# Patient Record
Sex: Male | Born: 1935 | Race: White | Hispanic: No | Smoking: Former smoker
Health system: Southern US, Community
[De-identification: ages and names within clinical notes are randomized; demographics above are authoritative.]

## PROBLEM LIST (undated history)

## (undated) DIAGNOSIS — A0472 Enterocolitis due to Clostridium difficile, not specified as recurrent: Secondary | ICD-10-CM

## (undated) DIAGNOSIS — G43909 Migraine, unspecified, not intractable, without status migrainosus: Secondary | ICD-10-CM

## (undated) DIAGNOSIS — M1990 Unspecified osteoarthritis, unspecified site: Secondary | ICD-10-CM

## (undated) DIAGNOSIS — Z860100 Personal history of colon polyps, unspecified: Secondary | ICD-10-CM

## (undated) DIAGNOSIS — I10 Essential (primary) hypertension: Secondary | ICD-10-CM

## (undated) DIAGNOSIS — J45909 Unspecified asthma, uncomplicated: Secondary | ICD-10-CM

## (undated) DIAGNOSIS — Z9289 Personal history of other medical treatment: Secondary | ICD-10-CM

## (undated) DIAGNOSIS — H3530 Unspecified macular degeneration: Secondary | ICD-10-CM

## (undated) HISTORY — DX: Personal history of colonic polyps: Z86.010

## (undated) HISTORY — DX: Personal history of colon polyps, unspecified: Z86.0100

## (undated) HISTORY — PX: APPENDECTOMY: SHX54

## (undated) HISTORY — PX: TONSILLECTOMY AND ADENOIDECTOMY: SUR1326

## (undated) HISTORY — DX: Enterocolitis due to Clostridium difficile, not specified as recurrent: A04.72

## (undated) HISTORY — PX: PHOTODYNAMIC THERAPY: SHX862

## (undated) HISTORY — DX: Migraine, unspecified, not intractable, without status migrainosus: G43.909

## (undated) HISTORY — PX: CATARACT EXTRACTION: SUR2

---

## 1998-09-30 DIAGNOSIS — K219 Gastro-esophageal reflux disease without esophagitis: Secondary | ICD-10-CM | POA: Insufficient documentation

## 2007-06-15 DIAGNOSIS — L219 Seborrheic dermatitis, unspecified: Secondary | ICD-10-CM | POA: Insufficient documentation

## 2010-10-31 HISTORY — PX: DENTAL SURGERY: SHX609

## 2012-01-08 DIAGNOSIS — Z131 Encounter for screening for diabetes mellitus: Secondary | ICD-10-CM | POA: Diagnosis not present

## 2012-01-08 DIAGNOSIS — J45909 Unspecified asthma, uncomplicated: Secondary | ICD-10-CM | POA: Diagnosis not present

## 2012-01-08 DIAGNOSIS — N400 Enlarged prostate without lower urinary tract symptoms: Secondary | ICD-10-CM | POA: Diagnosis not present

## 2012-01-08 DIAGNOSIS — Z125 Encounter for screening for malignant neoplasm of prostate: Secondary | ICD-10-CM | POA: Diagnosis not present

## 2012-01-08 DIAGNOSIS — Z0000 Encounter for general adult medical examination without abnormal findings: Secondary | ICD-10-CM | POA: Diagnosis not present

## 2012-02-05 DIAGNOSIS — H353190 Nonexudative age-related macular degeneration, unspecified eye, stage unspecified: Secondary | ICD-10-CM | POA: Diagnosis not present

## 2012-07-28 DIAGNOSIS — Z23 Encounter for immunization: Secondary | ICD-10-CM | POA: Diagnosis not present

## 2013-02-23 DIAGNOSIS — H3530 Unspecified macular degeneration: Secondary | ICD-10-CM | POA: Diagnosis not present

## 2013-04-26 DIAGNOSIS — Z125 Encounter for screening for malignant neoplasm of prostate: Secondary | ICD-10-CM | POA: Diagnosis not present

## 2013-04-26 DIAGNOSIS — Z0000 Encounter for general adult medical examination without abnormal findings: Secondary | ICD-10-CM | POA: Diagnosis not present

## 2013-04-26 DIAGNOSIS — L219 Seborrheic dermatitis, unspecified: Secondary | ICD-10-CM | POA: Diagnosis not present

## 2013-04-26 DIAGNOSIS — R3911 Hesitancy of micturition: Secondary | ICD-10-CM | POA: Diagnosis not present

## 2013-04-26 DIAGNOSIS — E669 Obesity, unspecified: Secondary | ICD-10-CM | POA: Diagnosis not present

## 2013-04-26 LAB — BASIC METABOLIC PANEL: GLUCOSE: 94 mg/dL

## 2013-04-26 LAB — PSA: PSA: 1.6

## 2013-04-26 LAB — TSH: TSH: 1.79 u[IU]/mL (ref <=5.90)

## 2013-07-20 ENCOUNTER — Ambulatory Visit: Payer: Self-pay | Admitting: Family Medicine

## 2013-07-20 DIAGNOSIS — R3911 Hesitancy of micturition: Secondary | ICD-10-CM | POA: Diagnosis not present

## 2013-07-20 DIAGNOSIS — R0602 Shortness of breath: Secondary | ICD-10-CM | POA: Diagnosis not present

## 2013-07-20 DIAGNOSIS — E669 Obesity, unspecified: Secondary | ICD-10-CM | POA: Diagnosis not present

## 2013-07-20 DIAGNOSIS — L219 Seborrheic dermatitis, unspecified: Secondary | ICD-10-CM | POA: Diagnosis not present

## 2013-07-29 DIAGNOSIS — Z23 Encounter for immunization: Secondary | ICD-10-CM | POA: Diagnosis not present

## 2013-08-12 DIAGNOSIS — E669 Obesity, unspecified: Secondary | ICD-10-CM | POA: Diagnosis not present

## 2013-08-12 DIAGNOSIS — L219 Seborrheic dermatitis, unspecified: Secondary | ICD-10-CM | POA: Diagnosis not present

## 2013-08-12 DIAGNOSIS — R3911 Hesitancy of micturition: Secondary | ICD-10-CM | POA: Diagnosis not present

## 2013-08-12 DIAGNOSIS — L570 Actinic keratosis: Secondary | ICD-10-CM | POA: Diagnosis not present

## 2013-08-12 DIAGNOSIS — J449 Chronic obstructive pulmonary disease, unspecified: Secondary | ICD-10-CM | POA: Diagnosis not present

## 2013-11-29 DIAGNOSIS — J45909 Unspecified asthma, uncomplicated: Secondary | ICD-10-CM | POA: Diagnosis not present

## 2013-11-29 DIAGNOSIS — J069 Acute upper respiratory infection, unspecified: Secondary | ICD-10-CM | POA: Diagnosis not present

## 2013-11-29 DIAGNOSIS — E669 Obesity, unspecified: Secondary | ICD-10-CM | POA: Diagnosis not present

## 2013-11-29 DIAGNOSIS — L219 Seborrheic dermatitis, unspecified: Secondary | ICD-10-CM | POA: Diagnosis not present

## 2014-03-24 DIAGNOSIS — H2510 Age-related nuclear cataract, unspecified eye: Secondary | ICD-10-CM | POA: Diagnosis not present

## 2014-03-24 DIAGNOSIS — Z961 Presence of intraocular lens: Secondary | ICD-10-CM | POA: Diagnosis not present

## 2014-06-20 ENCOUNTER — Ambulatory Visit: Payer: Self-pay | Admitting: Family Medicine

## 2014-06-20 DIAGNOSIS — M25569 Pain in unspecified knee: Secondary | ICD-10-CM | POA: Diagnosis not present

## 2014-06-20 DIAGNOSIS — M79609 Pain in unspecified limb: Secondary | ICD-10-CM | POA: Diagnosis not present

## 2014-06-20 DIAGNOSIS — L219 Seborrheic dermatitis, unspecified: Secondary | ICD-10-CM | POA: Diagnosis not present

## 2014-06-20 DIAGNOSIS — E669 Obesity, unspecified: Secondary | ICD-10-CM | POA: Diagnosis not present

## 2014-07-21 DIAGNOSIS — Z23 Encounter for immunization: Secondary | ICD-10-CM | POA: Diagnosis not present

## 2014-11-30 DIAGNOSIS — L570 Actinic keratosis: Secondary | ICD-10-CM | POA: Diagnosis not present

## 2014-11-30 DIAGNOSIS — Z23 Encounter for immunization: Secondary | ICD-10-CM | POA: Diagnosis not present

## 2014-11-30 DIAGNOSIS — Z0000 Encounter for general adult medical examination without abnormal findings: Secondary | ICD-10-CM | POA: Diagnosis not present

## 2014-11-30 DIAGNOSIS — L858 Other specified epidermal thickening: Secondary | ICD-10-CM | POA: Diagnosis not present

## 2014-11-30 DIAGNOSIS — J449 Chronic obstructive pulmonary disease, unspecified: Secondary | ICD-10-CM | POA: Diagnosis not present

## 2014-11-30 DIAGNOSIS — Z1389 Encounter for screening for other disorder: Secondary | ICD-10-CM | POA: Diagnosis not present

## 2014-11-30 DIAGNOSIS — N400 Enlarged prostate without lower urinary tract symptoms: Secondary | ICD-10-CM | POA: Diagnosis not present

## 2014-12-13 DIAGNOSIS — L821 Other seborrheic keratosis: Secondary | ICD-10-CM | POA: Diagnosis not present

## 2014-12-13 DIAGNOSIS — D485 Neoplasm of uncertain behavior of skin: Secondary | ICD-10-CM | POA: Diagnosis not present

## 2014-12-13 DIAGNOSIS — L570 Actinic keratosis: Secondary | ICD-10-CM | POA: Diagnosis not present

## 2014-12-13 DIAGNOSIS — D1801 Hemangioma of skin and subcutaneous tissue: Secondary | ICD-10-CM | POA: Diagnosis not present

## 2014-12-13 DIAGNOSIS — L820 Inflamed seborrheic keratosis: Secondary | ICD-10-CM | POA: Diagnosis not present

## 2014-12-13 DIAGNOSIS — X32XXXA Exposure to sunlight, initial encounter: Secondary | ICD-10-CM | POA: Diagnosis not present

## 2015-03-23 ENCOUNTER — Encounter: Payer: Self-pay | Admitting: Urgent Care

## 2015-03-23 ENCOUNTER — Emergency Department: Payer: Medicare Other

## 2015-03-23 ENCOUNTER — Encounter: Payer: Self-pay | Admitting: *Deleted

## 2015-03-23 ENCOUNTER — Emergency Department
Admission: EM | Admit: 2015-03-23 | Discharge: 2015-03-23 | Disposition: A | Discharge status: Home / Self Care | Payer: Medicare Other | Source: Home / Self Care | Attending: Emergency Medicine | Admitting: Emergency Medicine

## 2015-03-23 ENCOUNTER — Observation Stay
Admission: EM | Admit: 2015-03-23 | Discharge: 2015-03-24 | Disposition: A | Discharge status: Home / Self Care | Payer: Medicare Other | Attending: Internal Medicine | Admitting: Internal Medicine

## 2015-03-23 DIAGNOSIS — R0602 Shortness of breath: Secondary | ICD-10-CM

## 2015-03-23 DIAGNOSIS — M1990 Unspecified osteoarthritis, unspecified site: Secondary | ICD-10-CM | POA: Insufficient documentation

## 2015-03-23 DIAGNOSIS — R0609 Other forms of dyspnea: Secondary | ICD-10-CM | POA: Diagnosis not present

## 2015-03-23 DIAGNOSIS — Z79899 Other long term (current) drug therapy: Secondary | ICD-10-CM | POA: Insufficient documentation

## 2015-03-23 DIAGNOSIS — R062 Wheezing: Secondary | ICD-10-CM | POA: Diagnosis not present

## 2015-03-23 DIAGNOSIS — Z9889 Other specified postprocedural states: Secondary | ICD-10-CM | POA: Insufficient documentation

## 2015-03-23 DIAGNOSIS — H3530 Unspecified macular degeneration: Secondary | ICD-10-CM | POA: Insufficient documentation

## 2015-03-23 DIAGNOSIS — J811 Chronic pulmonary edema: Secondary | ICD-10-CM

## 2015-03-23 DIAGNOSIS — I10 Essential (primary) hypertension: Secondary | ICD-10-CM | POA: Insufficient documentation

## 2015-03-23 DIAGNOSIS — Z9849 Cataract extraction status, unspecified eye: Secondary | ICD-10-CM | POA: Insufficient documentation

## 2015-03-23 DIAGNOSIS — J449 Chronic obstructive pulmonary disease, unspecified: Secondary | ICD-10-CM | POA: Diagnosis not present

## 2015-03-23 DIAGNOSIS — Z66 Do not resuscitate: Secondary | ICD-10-CM | POA: Diagnosis not present

## 2015-03-23 DIAGNOSIS — R05 Cough: Secondary | ICD-10-CM | POA: Diagnosis not present

## 2015-03-23 DIAGNOSIS — R0989 Other specified symptoms and signs involving the circulatory and respiratory systems: Secondary | ICD-10-CM | POA: Diagnosis not present

## 2015-03-23 DIAGNOSIS — Z87891 Personal history of nicotine dependence: Secondary | ICD-10-CM | POA: Insufficient documentation

## 2015-03-23 DIAGNOSIS — J45901 Unspecified asthma with (acute) exacerbation: Secondary | ICD-10-CM | POA: Insufficient documentation

## 2015-03-23 DIAGNOSIS — J9801 Acute bronchospasm: Secondary | ICD-10-CM | POA: Insufficient documentation

## 2015-03-23 DIAGNOSIS — Z951 Presence of aortocoronary bypass graft: Secondary | ICD-10-CM | POA: Insufficient documentation

## 2015-03-23 DIAGNOSIS — Z7951 Long term (current) use of inhaled steroids: Secondary | ICD-10-CM | POA: Diagnosis not present

## 2015-03-23 DIAGNOSIS — Z91041 Radiographic dye allergy status: Secondary | ICD-10-CM | POA: Diagnosis not present

## 2015-03-23 DIAGNOSIS — E669 Obesity, unspecified: Secondary | ICD-10-CM | POA: Diagnosis present

## 2015-03-23 HISTORY — DX: Personal history of other medical treatment: Z92.89

## 2015-03-23 HISTORY — DX: Unspecified macular degeneration: H35.30

## 2015-03-23 HISTORY — DX: Unspecified osteoarthritis, unspecified site: M19.90

## 2015-03-23 HISTORY — DX: Unspecified asthma, uncomplicated: J45.909

## 2015-03-23 LAB — BLOOD GAS, ARTERIAL
ALLENS TEST (PASS/FAIL): POSITIVE — AB
Acid-base deficit: 0.7 mmol/L (ref 0.0–2.0)
Bicarbonate: 23.4 mEq/L (ref 21.0–28.0)
FIO2: 0.21 %
O2 Saturation: 94.1 %
PATIENT TEMPERATURE: 37
PH ART: 7.42 (ref 7.350–7.450)
PO2 ART: 70 mmHg — AB (ref 83.0–108.0)
pCO2 arterial: 36 mmHg (ref 32.0–48.0)

## 2015-03-23 LAB — CBC
HCT: 43.1 % (ref 40.0–52.0)
HEMOGLOBIN: 14.2 g/dL (ref 13.0–18.0)
MCH: 27.8 pg (ref 26.0–34.0)
MCHC: 33 g/dL (ref 32.0–36.0)
MCV: 84.1 fL (ref 80.0–100.0)
Platelets: 220 10*3/uL (ref 150–440)
RBC: 5.12 MIL/uL (ref 4.40–5.90)
RDW: 14.6 % — ABNORMAL HIGH (ref 11.5–14.5)
WBC: 9.1 10*3/uL (ref 3.8–10.6)

## 2015-03-23 LAB — BASIC METABOLIC PANEL
Anion gap: 9 (ref 5–15)
BUN: 14 mg/dL (ref 6–20)
CO2: 22 mmol/L (ref 22–32)
Calcium: 9.1 mg/dL (ref 8.9–10.3)
Chloride: 111 mmol/L (ref 101–111)
Creatinine, Ser: 0.86 mg/dL (ref 0.61–1.24)
GFR calc Af Amer: >60 mL/min (ref >60)
GFR calc non Af Amer: >60 mL/min (ref >60)
GLUCOSE: 124 mg/dL — AB (ref 65–99)
POTASSIUM: 3.7 mmol/L (ref 3.5–5.1)
Sodium: 142 mmol/L (ref 135–145)

## 2015-03-23 LAB — TROPONIN I: Troponin I: <0.03 ng/mL (ref <0.031)

## 2015-03-23 LAB — BRAIN NATRIURETIC PEPTIDE: B Natriuretic Peptide: 65 pg/mL (ref 0.0–100.0)

## 2015-03-23 MED ORDER — DEXTROSE 5 % IV SOLN
INTRAVENOUS | Status: AC
  Administered 2015-03-23: 500 mg via INTRAVENOUS
  Filled 2015-03-23: qty 500

## 2015-03-23 MED ORDER — IPRATROPIUM-ALBUTEROL 0.5-2.5 (3) MG/3ML IN SOLN
3.0000 mL | Freq: Once | RESPIRATORY_TRACT | Status: AC
  Administered 2015-03-23: 3 mL via RESPIRATORY_TRACT

## 2015-03-23 MED ORDER — IPRATROPIUM-ALBUTEROL 0.5-2.5 (3) MG/3ML IN SOLN
9.0000 mL | Freq: Once | RESPIRATORY_TRACT | Status: AC
  Administered 2015-03-23: 9 mL via RESPIRATORY_TRACT

## 2015-03-23 MED ORDER — MAGNESIUM SULFATE 2 GM/50ML IV SOLN
2.0000 g | Freq: Once | INTRAVENOUS | Status: AC
  Administered 2015-03-23: 2 g via INTRAVENOUS

## 2015-03-23 MED ORDER — DOXYCYCLINE HYCLATE 100 MG PO TABS
ORAL_TABLET | ORAL | Status: AC
  Filled 2015-03-23: qty 1

## 2015-03-23 MED ORDER — ASPIRIN 81 MG PO CHEW
CHEWABLE_TABLET | ORAL | Status: AC
  Filled 2015-03-23: qty 4

## 2015-03-23 MED ORDER — IPRATROPIUM-ALBUTEROL 0.5-2.5 (3) MG/3ML IN SOLN: 3.0000 mL | Freq: Once | RESPIRATORY_TRACT | Status: DC

## 2015-03-23 MED ORDER — FUROSEMIDE 10 MG/ML IJ SOLN
40.0000 mg | Freq: Once | INTRAMUSCULAR | Status: AC
  Administered 2015-03-23: 40 mg via INTRAVENOUS

## 2015-03-23 MED ORDER — IPRATROPIUM-ALBUTEROL 0.5-2.5 (3) MG/3ML IN SOLN
RESPIRATORY_TRACT | Status: AC
  Filled 2015-03-23: qty 9

## 2015-03-23 MED ORDER — ASPIRIN 81 MG PO CHEW
324.0000 mg | CHEWABLE_TABLET | Freq: Once | ORAL | Status: AC
  Administered 2015-03-23: 324 mg via ORAL

## 2015-03-23 MED ORDER — FUROSEMIDE 10 MG/ML IJ SOLN
INTRAMUSCULAR | Status: AC
  Administered 2015-03-23: 40 mg via INTRAVENOUS
  Filled 2015-03-23: qty 4

## 2015-03-23 MED ORDER — DOXYCYCLINE HYCLATE 100 MG PO TABS: 100.0000 mg | ORAL_TABLET | Freq: Once | ORAL | Status: DC

## 2015-03-23 MED ORDER — METHYLPREDNISOLONE SODIUM SUCC 125 MG IJ SOLR
125.0000 mg | Freq: Once | INTRAMUSCULAR | Status: AC
  Administered 2015-03-23: 125 mg via INTRAVENOUS

## 2015-03-23 MED ORDER — IPRATROPIUM-ALBUTEROL 0.5-2.5 (3) MG/3ML IN SOLN
RESPIRATORY_TRACT | Status: AC
  Administered 2015-03-23: 3 mL via RESPIRATORY_TRACT
  Filled 2015-03-23: qty 6

## 2015-03-23 MED ORDER — METHYLPREDNISOLONE SODIUM SUCC 125 MG IJ SOLR
INTRAMUSCULAR | Status: AC
  Filled 2015-03-23: qty 2

## 2015-03-23 MED ORDER — MAGNESIUM SULFATE 2 GM/50ML IV SOLN
INTRAVENOUS | Status: AC
  Administered 2015-03-23: 2 g via INTRAVENOUS
  Filled 2015-03-23: qty 50

## 2015-03-23 MED ORDER — DEXTROSE 5 % IV SOLN
500.0000 mg | Freq: Once | INTRAVENOUS | Status: AC
  Administered 2015-03-23: 500 mg via INTRAVENOUS

## 2015-03-23 NOTE — H&P (Addendum)
[facility] NAME: Ryan Bentley    MR#:  [id number]  DATE OF BIRTH:  September 13, 1932  DATE OF ADMISSION:  03/23/2015  PRIMARY CARE PHYSICIAN: No primary care provider on file.   REQUESTING/REFERRING PHYSICIAN: [place]  CHIEF COMPLAINT:   Chief Complaint  Patient presents with  . Shortness of Breath    HISTORY OF PRESENT ILLNESS:  Ryan Bentley  is a 81 y.o. male who presents with shortness of breath and dyspnea on exertion. Patient was just in the ED earlier this morning with shortness of breath, felt better after some nebulizer treatments and was discharged home. Shortly after he got home he began to experience and shortness of breath again, he waited through most of the day and decided to come back to the ED this evening when it was not getting any better. He also noted, specifically, some dyspnea on exertion as well throughout the day. He does not have a significant past medical history, and is not on any significant amount of medications. He does have a history of asthma and has had asthma exacerbation in the past. He has inhalers, but only uses them sporadically. Here in the ED he was treated with magnesium, nebulizers, steroids, and azithromycin. Hospitalists were called for asthma exacerbation and evaluation of dyspnea on exertion. Of note his chest x-ray in the ED did also show some mild vascular congestion.  PAST MEDICAL HISTORY:   Past Medical History  Diagnosis Date  . Arthritis   . Macular degeneration   . Asthma     PAST SURGICAL HISTORY:   Past Surgical History  Procedure Laterality Date  . Appendectomy    . Cataract extraction      x2    SOCIAL HISTORY:   History  Substance Use Topics  . Smoking status: Former Research scientist (life sciences)  . Smokeless tobacco: Not on file  . Alcohol Use: 0.0 oz/week    0 Standard drinks or equivalent per week     Comment: 1-2 glasses bourbon in water 3 times per week    FAMILY  HISTORY:   Family History  Problem Relation Age of Onset  . Family history unknown: Yes    DRUG ALLERGIES:   Allergies  Allergen Reactions  . Ivp Dye [Iodinated Diagnostic Agents]     MEDICATIONS AT HOME:   Prior to Admission medications   Medication Sig Start Date End Date Taking? Authorizing Provider  albuterol (PROVENTIL HFA;VENTOLIN HFA) 108 (90 BASE) MCG/ACT inhaler Inhale 2 puffs into the lungs every 6 (six) hours as needed for wheezing or shortness of breath.    Historical Provider, MD  Fish Oil-Cholecalciferol (FISH OIL + D3 PO) Take 1 capsule by mouth 2 (two) times daily.    Historical Provider, MD  fluticasone (VERAMYST) 27.5 MCG/SPRAY nasal spray Place 2 sprays into the nose daily as needed for rhinitis.    Historical Provider, MD  Multiple Vitamins-Minerals (PRESERVISION AREDS) TABS Take 1 tablet by mouth 2 (two) times daily.    Historical Provider, MD    REVIEW OF SYSTEMS:  Review of Systems  Constitutional: Negative for fever, chills, weight loss and malaise/fatigue.  HENT: Negative for ear pain, hearing loss and tinnitus.   Eyes: Negative for blurred vision, double vision, pain and redness.  Respiratory: Positive for shortness of breath. Negative for cough and hemoptysis.        Dyspnea on exertion  Cardiovascular: Negative for chest pain, palpitations, orthopnea and leg swelling.  Gastrointestinal: Negative for  nausea, vomiting, abdominal pain, diarrhea and constipation.  Genitourinary: Negative for dysuria, frequency and hematuria.  Musculoskeletal: Negative for back pain, joint pain and neck pain.  Skin:       No acne, rash, or lesions  Neurological: Negative for dizziness, tremors, focal weakness and weakness.  Endo/Heme/Allergies: Negative for polydipsia. Does not bruise/bleed easily.  Psychiatric/Behavioral: Negative for depression. The patient is not nervous/anxious and does not have insomnia.      VITAL SIGNS:   Filed Vitals:   03/23/15 2028  03/23/15 2126 03/23/15 2128  BP: 196/101 176/95 176/95  Pulse: 79 81 75  Temp: 97.7 F (36.5 C) 98.4 F (36.9 C)   TempSrc: Oral Oral   Resp: 20    Height: 5\' 10"  (1.778 m) 5\' 10"  (1.778 m)   Weight: 108.863 kg (240 lb) 108.863 kg (240 lb)   SpO2: 96% 94% 97%   Wt Readings from Last 3 Encounters:  03/23/15 108.863 kg (240 lb)  03/23/15 108.863 kg (240 lb)    PHYSICAL EXAMINATION:  Physical Exam  Constitutional: He is oriented to person, place, and time. He appears well-developed and well-nourished. No distress.  HENT:  Head: Normocephalic and atraumatic.  Mouth/Throat: Oropharynx is clear and moist.  Eyes: Conjunctivae and EOM are normal. Pupils are equal, round, and reactive to light. No scleral icterus.  Neck: Normal range of motion. Neck supple. No JVD present. No thyromegaly present.  Cardiovascular: Normal rate, regular rhythm and intact distal pulses.  Exam reveals no gallop and no friction rub.   No murmur heard. Respiratory: Effort normal. No respiratory distress. He has wheezes (mild bilateral expiratory). He has no rales.  GI: Soft. Bowel sounds are normal. He exhibits no distension. There is no tenderness.  Musculoskeletal: Normal range of motion. He exhibits no edema.  No arthritis, no gout  Lymphadenopathy:    He has no cervical adenopathy.  Neurological: He is alert and oriented to person, place, and time. No cranial nerve deficit.  No dysarthria, no aphasia  Skin: Skin is warm and dry. No rash noted. No erythema.  Psychiatric: He has a normal mood and affect. His behavior is normal. Judgment and thought content normal.    LABORATORY PANEL:   CBC  Recent Labs Lab 03/23/15 0433  WBC 9.1  HGB 14.2  HCT 43.1  PLT 220   ------------------------------------------------------------------------------------------------------------------  Chemistries   Recent Labs Lab 03/23/15 0433  NA 142  K 3.7  CL 111  CO2 22  GLUCOSE 124*  BUN 14  CREATININE  0.86  CALCIUM 9.1   ------------------------------------------------------------------------------------------------------------------  Cardiac Enzymes  Recent Labs Lab 03/23/15 2148  TROPONINI <0.03   ------------------------------------------------------------------------------------------------------------------  RADIOLOGY:  Dg Chest 1 View  03/23/2015   CLINICAL DATA:  Acute onset of shortness of breath and wheezing. Initial encounter.  EXAM: CHEST  1 VIEW  COMPARISON:  Chest radiograph performed earlier today at 4:12 a.m.  FINDINGS: The lungs are well-aerated. Mild vascular congestion is noted. There is no evidence of focal opacification, pleural effusion or pneumothorax.  The cardiomediastinal silhouette is borderline normal in size. No acute osseous abnormalities are seen.  IMPRESSION: Mild vascular congestion noted.  Lungs remain grossly clear.   Electronically Signed   By: Garald Balding M.D.   On: 03/23/2015 22:23   Dg Chest 2 View (if Patient Has Fever And/or Copd)  03/23/2015   CLINICAL DATA:  Acute onset of shortness of breath and nonproductive cough. Difficulty breathing and wheezing. Initial encounter.  EXAM: CHEST  2 VIEW  COMPARISON:  Chest radiograph performed 07/20/2013  FINDINGS: The lungs are well-aerated. Vascular congestion is noted. Increased interstitial markings may reflect minimal interstitial edema. There is no evidence of pleural effusion or pneumothorax.  The heart is borderline normal in size. No acute osseous abnormalities are seen.  IMPRESSION: Vascular congestion noted. Increased interstitial markings may reflect minimal interstitial edema.   Electronically Signed   By: Garald Balding M.D.   On: 03/23/2015 05:25    EKG:   Orders placed or performed during the hospital encounter of 03/23/15  . ED EKG  (if patient has PMH of COPD)  . ED EKG  (if patient has PMH of COPD)  . EKG 12-Lead  . EKG 12-Lead    IMPRESSION AND PLAN:  Principal Problem:   Asthma  exacerbation - with recurrent ED visits for the same, we will admit, treat with steroids, nebulizers, azithromycin. Patient states he has Advair at home, but only uses it when necessary instead as scheduled. A likely need to restart this medication on a scheduled basis. Active Problems:   Dyspnea on exertion - no cardiac disease in his past medical history, but some mild congestion on his chest x-ray and new dyspnea on exertion raises a concern for some possible development of heart failure. We'll get a BNP, as well as an echocardiogram to evaluate.   Hypertension - no prior diagnosis, will use PRN antihypertensives here.  Consider starting therapy if HTN persists through hospital stay.   All the records are reviewed and case discussed with ED provider. Management plans discussed with the patient and/or family.  DVT PROPHYLAXIS: SubQ lovenox  ADMISSION STATUS: Inpatient  CODE STATUS: DO NOT RESUSCITATE  Advance Directive Documentation        Most Recent Value   Type of Advance Directive  Healthcare Power of Attorney, Living will   Pre-existing out of facility DNR order (yellow form or pink MOST form)     "MOST" Form in Place?        TOTAL TIME TAKING CARE OF THIS PATIENT:45 minutes.    Terrall Bley [place] 03/23/2015, 11:31 PM  [facility]  Office  [phone]  CC: Primary care physician; No primary care provider on file.

## 2015-03-23 NOTE — ED Notes (Signed)
Pt is back today for shortness of breath. Pt has audible wheezing. Pt was seen today for same and diagnosed with "bronchial spasms" and discharged home.  Pt began having shortness of breath and wheezing again this pm.  No CP with this.

## 2015-03-23 NOTE — ED Provider Notes (Signed)
-----------------------------------------   9:50 AM on 03/23/2015 -----------------------------------------  Patient BNP normal. Patient blood gas without any concerning findings. Patient does state he is feeling better. I offered admission however patient stated he would like to go home. He does have an inhaler which I advised he use consistently for the next 24-48 hours. Patient states he will call his PCP today to arrange close follow up. We discussed return precautions.  Nance Pear, MD 03/23/15 [phone]

## 2015-03-23 NOTE — Discharge Instructions (Signed)
As discussed, please follow up with her primary care doctor. Additionally we recommend that you see a cardiologist to have an echocardiogram done. Please seek medical attention for any high fevers, chest pain, shortness of breath, change in behavior, persistent vomiting, bloody stool or any other new or concerning symptoms.  Bronchospasm A bronchospasm is a spasm or tightening of the airways going into the lungs. During a bronchospasm breathing becomes more difficult because the airways get smaller. When this happens there can be coughing, a whistling sound when breathing (wheezing), and difficulty breathing. Bronchospasm is often associated with asthma, but not all patients who experience a bronchospasm have asthma. CAUSES  A bronchospasm is caused by inflammation or irritation of the airways. The inflammation or irritation may be triggered by:   Allergies (such as to animals, pollen, food, or mold). Allergens that cause bronchospasm may cause wheezing immediately after exposure or many hours later.   Infection. Viral infections are believed to be the most common cause of bronchospasm.   Exercise.   Irritants (such as pollution, cigarette smoke, strong odors, aerosol sprays, and paint fumes).   Weather changes. Winds increase molds and pollens in the air. Rain refreshes the air by washing irritants out. Cold air may cause inflammation.   Stress and emotional upset.  SIGNS AND SYMPTOMS   Wheezing.   Excessive nighttime coughing.   Frequent or severe coughing with a simple cold.   Chest tightness.   Shortness of breath.  DIAGNOSIS  Bronchospasm is usually diagnosed through a history and physical exam. Tests, such as chest X-rays, are sometimes done to look for other conditions. TREATMENT   Inhaled medicines can be given to open up your airways and help you breathe. The medicines can be given using either an inhaler or a nebulizer machine.  Corticosteroid medicines may be  given for severe bronchospasm, usually when it is associated with asthma. HOME CARE INSTRUCTIONS   Always have a plan prepared for seeking medical care. Know when to call your health care provider and local emergency services (911 in the [place] Know where you can access local emergency care.  Only take medicines as directed by your health care provider.  If you were prescribed an inhaler or nebulizer machine, ask your health care provider to explain how to use it correctly. Always use a spacer with your inhaler if you were given one.  It is necessary to remain calm during an attack. Try to relax and breathe more slowly.  Control your home environment in the following ways:   Change your heating and air conditioning filter at least once a month.   Limit your use of fireplaces and wood stoves.  Do not smoke and do not allow smoking in your home.   Avoid exposure to perfumes and fragrances.   Get rid of pests (such as roaches and mice) and their droppings.   Throw away plants if you see mold on them.   Keep your house clean and dust free.   Replace carpet with wood, tile, or vinyl flooring. Carpet can trap dander and dust.   Use allergy-proof pillows, mattress covers, and box spring covers.   Wash bed sheets and blankets every week in hot water and dry them in a dryer.   Use blankets that are made of polyester or cotton.   Wash hands frequently. SEEK MEDICAL CARE IF:   You have muscle aches.   You have chest pain.   The sputum changes from clear or white to yellow, green,  gray, or bloody.   The sputum you cough up gets thicker.   There are problems that may be related to the medicine you are given, such as a rash, itching, swelling, or trouble breathing.  SEEK IMMEDIATE MEDICAL CARE IF:   You have worsening wheezing and coughing even after taking your prescribed medicines.   You have increased difficulty breathing.   You develop severe chest  pain. MAKE SURE YOU:   Understand these instructions.  Will watch your condition.  Will get help right away if you are not doing well or get worse. Document Released: 09/19/2003 Document Revised: 09/21/2013 Document Reviewed: 03/08/2013 [facility] Patient Information 2015 [place], [place]. This information is not intended to replace advice given to you by your health care provider. Make sure you discuss any questions you have with your health care provider.

## 2015-03-23 NOTE — ED Provider Notes (Signed)
[facility] Emergency Department Provider Note  ____________________________________________  Time seen: Approximately 6:48 AM  I have reviewed the triage vital signs and the nursing notes.   HISTORY  Chief Complaint Shortness of Breath  HPI Ryan Bentley is a 81 y.o. male who has had progressive worsening shortness of breath over the past several days. Patient states that he has had history of the same in the past but has not had any significant breathing problems for quite some time. Patient's symptoms started 2-3 days ago and it just progressively gotten worse. Patient states that he can hardly get up and walk across a room without having significant dyspnea on exertion. Patient has a rescue inhaler at home but has never had to use any nebulizer treatments. Patient denies any significant history of congestive heart failure and feels like he has had primarily cough and cold symptoms that have progressed and this point over the past week. Patient denies any significant fever or chills. His cough is nonproductive. He denies any septic chest pain like swelling or abdominal complaints. Patient does state he is having pretty significant dyspnea on exertion. He is not having any pain at this time. And exertion and walking of course makes it much worse.   History reviewed. No pertinent past medical history.  There are no active problems to display for this patient.   Past Surgical History  Procedure Laterality Date  . Appendectomy    . Cataract extraction      No current outpatient prescriptions on file.  Allergies Ivp dye  No family history on file.  Social History History  Substance Use Topics  . Smoking status: Former Research scientist (life sciences)  . Smokeless tobacco: Not on file  . Alcohol Use: Yes    Review of Systems Constitutional: No fever/chills Eyes: No visual changes.  ENT: No sore throat. Cardiovascular: Denies chest pain. Respiratory: Patient with  progressive worsening shortness of breath over the past several days. Patient with significant dyspnea on exertion. Gastrointestinal: No abdominal pain.  No nausea, no vomiting.  No diarrhea.  No constipation. Genitourinary: Negative for dysuria. Musculoskeletal: Negative for back pain. Skin: Negative for rash. Neurological: Negative for headaches, focal weakness or numbness.  10-point ROS otherwise negative.  ____________________________________________   PHYSICAL EXAM:  VITAL SIGNS: ED Triage Vitals  Enc Vitals Group     BP 03/23/15 0402 175/67 mmHg     Pulse Rate 03/23/15 0402 72     Resp 03/23/15 0402 18     Temp 03/23/15 0402 97.9 F (36.6 C)     Temp Source 03/23/15 0402 Oral     SpO2 03/23/15 0402 93 %     Weight 03/23/15 0402 240 lb (108.863 kg)     Height 03/23/15 0402 5\' 10"  (1.778 m)     Head Cir --      Peak Flow --      Pain Score 03/23/15 0403 0     Pain Loc --      Pain Edu? --      Excl. in [place]? --     Constitutional: Alert and oriented. Well appearing in mild distress secondary to his shortness of breath. Eyes: Conjunctivae are normal. PERRL. EOMI. Head: Atraumatic. Nose: No congestion/rhinnorhea. Mouth/Throat: Mucous membranes are moist.  Oropharynx non-erythematous. Neck: No stridor.   Cardiovascular: Normal rate, regular rhythm. Grossly normal heart sounds.  Good peripheral circulation. Respiratory: Normal respiratory effort. Patient's lungs with diffuse wheezing bilaterally. Good air exchange. He has some mild tachypnea and mild  retractions. Gastrointestinal: Soft and nontender. No distention. No abdominal bruits. No CVA tenderness. Musculoskeletal: No lower extremity tenderness nor edema.  No joint effusions. Neurologic:  Normal speech and language. No gross focal neurologic deficits are appreciated. Speech is normal. No gait instability. Skin:  Skin is warm, dry and intact. No rash noted. Psychiatric: Mood and affect are normal. Speech and behavior  are normal.  ____________________________________________   LABS (all labs ordered are listed, but only abnormal results are displayed)  Labs Reviewed  BASIC METABOLIC PANEL - Abnormal; Notable for the following:    Glucose, Bld 124 (*)    All other components within normal limits  CBC - Abnormal; Notable for the following:    RDW 14.6 (*)    All other components within normal limits  TROPONIN I   ____________________________________________  EKG  ED ECG REPORT I, Ruby Cola, the attending physician, personally viewed and interpreted this ECG.   Date: 03/23/2015  EKG Time: 0403  Rate: 66  Rhythm: normal EKG, normal sinus rhythm, there are no previous tracings available for comparison, normal sinus rhythm, 1st degree AV block, poor R-wave progression and sinus arrhythmia.  Axis: Normal  Intervals:first-degree A-V block   ST&T Change: None  ____________________________________________  RADIOLOGY  Dg Chest 2 View (if Patient Has Fever And/or Copd)  03/23/2015   CLINICAL DATA:  Acute onset of shortness of breath and nonproductive cough. Difficulty breathing and wheezing. Initial encounter.  EXAM: CHEST  2 VIEW  COMPARISON:  Chest radiograph performed 07/20/2013  FINDINGS: The lungs are well-aerated. Vascular congestion is noted. Increased interstitial markings may reflect minimal interstitial edema. There is no evidence of pleural effusion or pneumothorax.  The heart is borderline normal in size. No acute osseous abnormalities are seen.  IMPRESSION: Vascular congestion noted. Increased interstitial markings may reflect minimal interstitial edema.   Electronically Signed   By: Garald Balding M.D.   On: 03/23/2015 05:25    ____________________________________________   PROCEDURES  Procedure(s) performed: None  Critical Care performed: No  ____________________________________________   INITIAL IMPRESSION / ASSESSMENT AND PLAN / ED COURSE  Pertinent labs & imaging  results that were available during my care of the patient were reviewed by me and considered in my medical decision making (see chart for details).  Patient is going be given some breathing treatments as well as some IV Lasix while awaiting labs and any other studies needed.. Patient's chest x-ray shows some vascular congestion. Patient will get labs EKG and cardiac enzymes at this time as well.  ----------------------------------------- 6:59 AM on 03/23/2015 -----------------------------------------  Assuming care from Dr. Lovena Le.  In short, Ryan Bentley is a 81 y.o. male with a chief complaint of Shortness of Breath .  Refer to the original H&P for additional details.  The current plan of care is to await labs and reevaluation after breathing treatments and IV Lasix. Pt was signed out to Dr. Archie Balboa FINAL CLINICAL IMPRESSION(S) / ED DIAGNOSES  Final diagnoses:  Shortness of breath  Pulmonary vascular congestion  Bronchospasm      Ruby Cola, MD 03/23/15 [phone]

## 2015-03-23 NOTE — ED Notes (Signed)
Patient presents with 2-3 day h/o SOB with NP cough. Patient reports similar episode x 1 year ago - ended up being bronchitis.

## 2015-03-23 NOTE — ED Provider Notes (Signed)
[facility] Emergency Department Provider Note  ____________________________________________  Time seen: Approximately 443 PM  I have reviewed the triage vital signs and the nursing notes.   HISTORY  Chief Complaint Shortness of Breath    HPI Ryan Bentley is a 81 y.o. male with a history of bronchospasm who presents tonight as a bounce back with shortness of breath. He says he was here earlier today about 3:30 in the morning with wheezing. He was given a breathing treatment as well as Lasix for pulmonary vascular congestion. He said that upon leaving the hospital he felt "great." However, after several hours at home his breathing difficulty returns. He denies any pain at this time. No nausea or vomiting. He has been having a cough for several days leading up to this. He says that he has Advair that he takes at home for a previous bronchospasm. He does not have a formal diagnosis of COPD.   History reviewed. No pertinent past medical history.  There are no active problems to display for this patient.   Past Surgical History  Procedure Laterality Date  . Appendectomy    . Cataract extraction      Current Outpatient Rx  Name  Route  Sig  Dispense  Refill  . albuterol (PROVENTIL HFA;VENTOLIN HFA) 108 (90 BASE) MCG/ACT inhaler   Inhalation   Inhale 2 puffs into the lungs every 6 (six) hours as needed for wheezing or shortness of breath.         . Fish Oil-Cholecalciferol (FISH OIL + D3 PO)   Oral   Take 1 capsule by mouth 2 (two) times daily.         . fluticasone (VERAMYST) 27.5 MCG/SPRAY nasal spray   Nasal   Place 2 sprays into the nose daily as needed for rhinitis.         . Multiple Vitamins-Minerals (PRESERVISION AREDS) TABS   Oral   Take 1 tablet by mouth 2 (two) times daily.           Allergies Ivp dye  No family history on file.  Social History History  Substance Use Topics  . Smoking status: Former Research scientist (life sciences)  .  Smokeless tobacco: Not on file  . Alcohol Use: Yes    Review of Systems Constitutional: No fever/chills Eyes: No visual changes. ENT: No sore throat. Cardiovascular: Denies chest pain. Respiratory: As above Gastrointestinal: No abdominal pain.  No nausea, no vomiting.  No diarrhea.  No constipation. Genitourinary: Negative for dysuria. Musculoskeletal: Negative for back pain. Skin: Negative for rash. Neurological: Negative for headaches, focal weakness or numbness.  10-point ROS otherwise negative.  ____________________________________________   PHYSICAL EXAM:  VITAL SIGNS: ED Triage Vitals  Enc Vitals Group     BP 03/23/15 2028 196/101 mmHg     Pulse Rate 03/23/15 2028 79     Resp 03/23/15 2028 20     Temp 03/23/15 2028 97.7 F (36.5 C)     Temp Source 03/23/15 2028 Oral     SpO2 03/23/15 2028 96 %     Weight 03/23/15 2028 240 lb (108.863 kg)     Height 03/23/15 2028 5\' 10"  (1.778 m)     Head Cir --      Peak Flow --      Pain Score 03/23/15 2029 0     Pain Loc --      Pain Edu? --      Excl. in [place]? --     Constitutional: Alert and  oriented. Well appearing and in no acute distress. Eyes: Conjunctivae are normal. PERRL. EOMI. Head: Atraumatic. Nose: No congestion/rhinnorhea. Mouth/Throat: Mucous membranes are moist.  Oropharynx non-erythematous. Neck: No stridor.   Cardiovascular: Normal rate, regular rhythm. Grossly normal heart sounds.  Good peripheral circulation. Respiratory: Increased work of breathing. Speaks in full sentences. Mild supraclavicular retractions. Diffuse wheezing with prolonged expiratory phase and decreased air movement. Gastrointestinal: Soft and nontender. No distention. No abdominal bruits. No CVA tenderness. Easily reducible umbilical hernia that is about 4 cm and circular. Nontender. Musculoskeletal: No lower extremity tenderness nor edema.  No joint effusions. Neurologic:  Normal speech and language. No gross focal neurologic deficits  are appreciated. Speech is normal. No gait instability. Skin:  Skin is warm, dry and intact. No rash noted. Psychiatric: Mood and affect are normal. Speech and behavior are normal.  ____________________________________________   LABS (all labs ordered are listed, but only abnormal results are displayed)  Labs Reviewed  TROPONIN I   ____________________________________________  EKG  ED ECG REPORT I, Schaevitz,  Youlanda Roys, the attending physician, personally viewed and interpreted this ECG.   Date: 03/23/2015  EKG Time: 2031  Rate: 83  Rhythm: normal sinus rhythm with sinus arrhythmia  Axis: Normal axis  Intervals: First degree AV block.  ST&T Change: No ST elevations or depressions. No abnormal T-wave inversions. Several PVCs.  ____________________________________________  RADIOLOGY  Chest x-ray with mild vascular congestion. I personally reviewed the images. ____________________________________________   PROCEDURES    ____________________________________________   INITIAL IMPRESSION / ASSESSMENT AND PLAN / ED COURSE  Pertinent labs & imaging results that were available during my care of the patient were reviewed by me and considered in my medical decision making (see chart for details).  ----------------------------------------- 10:46 PM on 03/23/2015 -----------------------------------------  Patient says feels better after breathing treatments. However, still with increased work of breathing and wheezing. He has less wheezing than prior. However, I feel that given that he is bouncing back within 24 hours and has persistent symptoms that he will benefit from admission to the hospital. Signed out to Dr. Jannifer Franklin. ____________________________________________   FINAL CLINICAL IMPRESSION(S) / ED DIAGNOSES  Acute bronchospasm. Return visit.    Orbie Pyo, MD 03/23/15 [id number]

## 2015-03-23 NOTE — ED Notes (Signed)
Pt uprite on stretcher in exam room with no distress noted; pt reports nonprod cough & SOB last 2-3 days; denies pain; st hx of same with dx bronchitis

## 2015-03-23 NOTE — ED Notes (Signed)
Dr Archie Balboa aware of pt showing a fib on monitor, pt denies history of this

## 2015-03-23 NOTE — ED Notes (Signed)
Voided at least 1000cc urine

## 2015-03-24 ENCOUNTER — Inpatient Hospital Stay: Payer: Medicare Other

## 2015-03-24 ENCOUNTER — Inpatient Hospital Stay (HOSPITAL_BASED_OUTPATIENT_CLINIC_OR_DEPARTMENT_OTHER)
Admit: 2015-03-24 | Discharge: 2015-03-24 | Disposition: A | Discharge status: Home / Self Care | Payer: Medicare Other | Attending: Internal Medicine | Admitting: Internal Medicine

## 2015-03-24 ENCOUNTER — Telehealth: Payer: Self-pay

## 2015-03-24 DIAGNOSIS — J449 Chronic obstructive pulmonary disease, unspecified: Secondary | ICD-10-CM | POA: Diagnosis not present

## 2015-03-24 DIAGNOSIS — J45901 Unspecified asthma with (acute) exacerbation: Secondary | ICD-10-CM

## 2015-03-24 DIAGNOSIS — I2510 Atherosclerotic heart disease of native coronary artery without angina pectoris: Secondary | ICD-10-CM | POA: Diagnosis not present

## 2015-03-24 DIAGNOSIS — R0609 Other forms of dyspnea: Secondary | ICD-10-CM | POA: Diagnosis not present

## 2015-03-24 DIAGNOSIS — R0600 Dyspnea, unspecified: Secondary | ICD-10-CM

## 2015-03-24 DIAGNOSIS — I10 Essential (primary) hypertension: Secondary | ICD-10-CM | POA: Diagnosis not present

## 2015-03-24 LAB — BASIC METABOLIC PANEL
ANION GAP: 6 (ref 5–15)
BUN: 14 mg/dL (ref 6–20)
CO2: 24 mmol/L (ref 22–32)
Calcium: 8.8 mg/dL — ABNORMAL LOW (ref 8.9–10.3)
Chloride: 110 mmol/L (ref 101–111)
Creatinine, Ser: 0.85 mg/dL (ref 0.61–1.24)
GFR calc Af Amer: >60 mL/min (ref >60)
GFR calc non Af Amer: >60 mL/min (ref >60)
Glucose, Bld: 168 mg/dL — ABNORMAL HIGH (ref 65–99)
POTASSIUM: 3.5 mmol/L (ref 3.5–5.1)
SODIUM: 140 mmol/L (ref 135–145)

## 2015-03-24 LAB — CBC
HCT: 41.6 % (ref 40.0–52.0)
HCT: 42.3 % (ref 40.0–52.0)
Hemoglobin: 14.1 g/dL (ref 13.0–18.0)
Hemoglobin: 14.5 g/dL (ref 13.0–18.0)
MCH: 28 pg (ref 26.0–34.0)
MCH: 28.5 pg (ref 26.0–34.0)
MCHC: 33.8 g/dL (ref 32.0–36.0)
MCHC: 34.2 g/dL (ref 32.0–36.0)
MCV: 82.8 fL (ref 80.0–100.0)
MCV: 83.1 fL (ref 80.0–100.0)
PLATELETS: 217 10*3/uL (ref 150–440)
PLATELETS: 214 10*3/uL (ref 150–440)
RBC: 5.03 MIL/uL (ref 4.40–5.90)
RBC: 5.09 MIL/uL (ref 4.40–5.90)
RDW: 14.4 % (ref 11.5–14.5)
RDW: 14.6 % — ABNORMAL HIGH (ref 11.5–14.5)
WBC: 11.5 10*3/uL — ABNORMAL HIGH (ref 3.8–10.6)
WBC: 8.8 10*3/uL (ref 3.8–10.6)

## 2015-03-24 LAB — BRAIN NATRIURETIC PEPTIDE: B NATRIURETIC PEPTIDE 5: 88 pg/mL (ref 0.0–100.0)

## 2015-03-24 MED ORDER — SENNOSIDES-DOCUSATE SODIUM 8.6-50 MG PO TABS: 1.0000 | ORAL_TABLET | Freq: Every evening | ORAL | Status: DC | PRN

## 2015-03-24 MED ORDER — HYDRALAZINE HCL 20 MG/ML IJ SOLN
10.0000 mg | INTRAMUSCULAR | Status: DC | PRN
  Administered 2015-03-24: 10 mg via INTRAVENOUS
  Filled 2015-03-24 (×3): qty 0.5

## 2015-03-24 MED ORDER — DIPHENHYDRAMINE HCL 25 MG PO CAPS
50.0000 mg | ORAL_CAPSULE | Freq: Once | ORAL | Status: AC
  Administered 2015-03-24: 50 mg via ORAL
  Filled 2015-03-24: qty 2

## 2015-03-24 MED ORDER — AZITHROMYCIN 500 MG IV SOLR
500.0000 mg | INTRAVENOUS | Status: DC
  Filled 2015-03-24: qty 500

## 2015-03-24 MED ORDER — METHYLPREDNISOLONE SODIUM SUCC 125 MG IJ SOLR
60.0000 mg | Freq: Four times a day (QID) | INTRAMUSCULAR | Status: DC
  Administered 2015-03-24 (×2): 60 mg via INTRAVENOUS
  Filled 2015-03-24 (×2): qty 2

## 2015-03-24 MED ORDER — HYDRALAZINE HCL 10 MG PO TABS
10.0000 mg | ORAL_TABLET | Freq: Three times a day (TID) | ORAL | Status: DC
Start: 2015-03-24 — End: 2015-03-24
  Administered 2015-03-24: 10 mg via ORAL
  Filled 2015-03-24: qty 1

## 2015-03-24 MED ORDER — LISINOPRIL 20 MG PO TABS
20.0000 mg | ORAL_TABLET | Freq: Every day | ORAL | Status: DC
Start: 2015-03-24 — End: 2015-03-24
  Administered 2015-03-24: 20 mg via ORAL
  Filled 2015-03-24: qty 1

## 2015-03-24 MED ORDER — ACETAMINOPHEN 325 MG PO TABS: 650.0000 mg | ORAL_TABLET | Freq: Four times a day (QID) | ORAL | Status: DC | PRN

## 2015-03-24 MED ORDER — IPRATROPIUM-ALBUTEROL 0.5-2.5 (3) MG/3ML IN SOLN: 9.0000 mL | RESPIRATORY_TRACT | Status: DC | PRN

## 2015-03-24 MED ORDER — FOSINOPRIL SODIUM 20 MG PO TABS: 20.0000 mg | ORAL_TABLET | Freq: Every day | ORAL | Status: DC

## 2015-03-24 MED ORDER — ACETAMINOPHEN 650 MG RE SUPP: 650.0000 mg | Freq: Four times a day (QID) | RECTAL | Status: DC | PRN

## 2015-03-24 MED ORDER — IOHEXOL 350 MG/ML SOLN
100.0000 mL | Freq: Once | INTRAVENOUS | Status: AC | PRN
  Administered 2015-03-24: 100 mL via INTRAVENOUS

## 2015-03-24 MED ORDER — ENOXAPARIN SODIUM 40 MG/0.4ML ~~LOC~~ SOLN
40.0000 mg | SUBCUTANEOUS | Status: DC
  Administered 2015-03-24: 40 mg via SUBCUTANEOUS
  Filled 2015-03-24 (×2): qty 0.4

## 2015-03-24 MED ORDER — SODIUM CHLORIDE 0.9 % IJ SOLN
3.0000 mL | Freq: Two times a day (BID) | INTRAMUSCULAR | Status: DC
  Administered 2015-03-24: 3 mL via INTRAVENOUS

## 2015-03-24 MED ORDER — FLUTICASONE-SALMETEROL 250-50 MCG/DOSE IN AEPB: 1.0000 | INHALATION_SPRAY | Freq: Two times a day (BID) | RESPIRATORY_TRACT | Status: DC

## 2015-03-24 MED ORDER — AZITHROMYCIN 500 MG PO TABS: 500.0000 mg | ORAL_TABLET | Freq: Every day | ORAL | Status: DC

## 2015-03-24 NOTE — Progress Notes (Signed)
*  PRELIMINARY RESULTS* Echocardiogram 2D Echocardiogram has been performed.  Ryan Bentley 03/24/2015, 8:08 AM

## 2015-03-24 NOTE — Telephone Encounter (Signed)
-----   Message from Blain Pais sent at 03/24/2015 12:27 PM EDT ----- Regarding: tcm/ph 05/15/2015 3:00  Dr. Fletcher Anon

## 2015-03-24 NOTE — Care Management (Signed)
Patient discharging home today. Azithromax cost $6 and pharmacy will provide 3 tabs for $0 until order for remaining Rx arrives tomorrow. No further RNCM needs.

## 2015-03-24 NOTE — Progress Notes (Signed)
PT DISCHARGED HOME ON NEW FOSINOPRIL,ZITHROMAX AND ADVAIR. I/S ON  MED ADM/.UNDERSTANDING VOICED. BP WNL . I/S ON F/U APPTS. LEFT VIA W/C

## 2015-03-24 NOTE — Consult Note (Signed)
Cardiology Consultation Note  Patient ID: Ryan Bentley, MRN: [id number], DOB/AGE: 04/24/1932 81 y.o. Admit date: 03/23/2015   Date of Consult: 03/24/2015 Primary Physician: No primary care provider on file. Primary Cardiologist: New to [facility]  Chief Complaint: SOB Reason for Consult: SOB  HPI: 81 y.o. male with h/o asthma, arthritis, macular degeneration and no previously known cardiac history who presented to [facility] x 2 on 6/23 with increased dyspnea and was diagnosed with asthma exacerbation.   He has not previously known cardiac history and has never seen a cardiologist before. No prior echos, stress tests, or cardiac catheterizations. He prefers to not take any medications and only takes them if he absolutely must. He has known asthma dating back to when he was a child that did improve throughout most of his adulthood, though appears to be coming back in his senior years, requiring him to use his Advair Diskus inhaler occasionally, as he does not like to take medications on a daily basis per above. He also has a rescue albuterol inhaler that he has and uses this at the same time when he must use his Advair Diskus inhaler. He has had one severe asthma flare several years ago which he reports almost "killed" him. It is also noted approximately one year ago he had similar symptoms to the above with increase SOB for several days and ultimately ended up being diagnosed at bronchitis.   He previously rode his bike 20,000 miles per year several years ago. He lives a fairly sedentary lifestyle these days. He reports not exercising or walking as much as he would like to. His dog is deaf, has cataracts, and arthritis and does not go on walks anymore.   His weight has been slowly increasing he reports over the years per poor diet choices he reports. No lower extremity edema, orthopnea, early satiety, or abdominal distension. He does not add salt to food or drink excess amounts of salt.   He presents to  [facility] ED around 3:30 AM on 6/23 with increased dyspnea x several days with associated congestion. CXR showed minimal interstitial edema. Negative troponin. WBC 9.1. SCr 0.86. BNP 65. ABG with pO2 70, otherwise unremarkable. He remained in the ED until 10:30 AM receiving several nebs with full resolution of symptoms. He was discharged home and did some work/played with his dog with return of his SOB and wheezing prompting him to return to [facility] ED that evening for evaluation. Upon his arrival he received multiple nebs with improvement in symptoms again. CXR showed mild vascular congestion. Lungs remained grossly clear. Troponin negative. BNP 88. WBC 11.5-->8.8. Echo showed EF 55-60%, no wall motion abnormalities, normal diastolic function, trivial TR, normal PASP. CTA chest has been ordered to rule out PE. He was started on azithromycin, nebs, and steroids. This morning reports feeling back to his baseline and is without any SOB. Never with any chest pain, palpitations, edema, nausea, vomiting, presyncope, syncope.     Past Medical History  Diagnosis Date  . Arthritis   . Macular degeneration   . Asthma   . COPD (chronic obstructive pulmonary disease)   . Anemia   . History of echocardiogram     a. 03/2015: a. EF 55-60%, no WMA, nl diastolic fxn, trivial TR, nl PASP  . HTN (hypertension)       Most Recent Cardiac Studies: Echo 03/24/2015:  Study Conclusions  - Left ventricle: The cavity size was normal. There was mild concentric hypertrophy. Systolic function was normal. The estimated  ejection fraction was in the range of 55% to 60%. Wall motion was normal; there were no regional wall motion abnormalities. Left ventricular diastolic function parameters were normal. - Tricuspid valve: There was trivial regurgitation. - Pulmonary arteries: Systolic pressure was within the normal range.   Surgical History:  Past Surgical History  Procedure Laterality Date  . Appendectomy    .  Cataract extraction      x2  . Vascular surgery    . Coronary artery bypass graft       Home Meds: Prior to Admission medications   Medication Sig Start Date End Date Taking? Authorizing Provider  albuterol (PROVENTIL HFA;VENTOLIN HFA) 108 (90 BASE) MCG/ACT inhaler Inhale 2 puffs into the lungs every 6 (six) hours as needed for wheezing or shortness of breath.   Yes Historical Provider, MD  Fish Oil-Cholecalciferol (FISH OIL + D3 PO) Take 1 capsule by mouth 2 (two) times daily.   Yes Historical Provider, MD  fluticasone (VERAMYST) 27.5 MCG/SPRAY nasal spray Place 2 sprays into the nose daily as needed for rhinitis.   Yes Historical Provider, MD  Multiple Vitamins-Minerals (PRESERVISION AREDS) TABS Take 1 tablet by mouth 2 (two) times daily.   Yes Historical Provider, MD  tamsulosin (FLOMAX) 0.4 MG CAPS capsule Take 0.4 mg by mouth as needed.   Yes Historical Provider, MD    Inpatient Medications:  . azithromycin (ZITHROMAX) 500 MG IVPB  500 mg Intravenous Q24H  . diphenhydrAMINE  50 mg Oral Once  . enoxaparin (LOVENOX) injection  40 mg Subcutaneous Q24H  . hydrALAZINE  10 mg Oral 3 times per day  . lisinopril  20 mg Oral Daily  . methylPREDNISolone (SOLU-MEDROL) injection  60 mg Intravenous Q6H  . sodium chloride  3 mL Intravenous Q12H      Allergies:  Allergies  Allergen Reactions  . Ivp Dye [Iodinated Diagnostic Agents]     History   Social History  . Marital Status: Widowed    Spouse Name: N/A  . Number of Children: N/A  . Years of Education: N/A   Occupational History  . Not on file.   Social History Main Topics  . Smoking status: Former Research scientist (life sciences)  . Smokeless tobacco: Not on file  . Alcohol Use: 0.0 oz/week    0 Standard drinks or equivalent per week     Comment: 1-2 glasses bourbon in water 3 times per week  . Drug Use: No  . Sexual Activity: No   Other Topics Concern  . Not on file   Social History Narrative     Family History  Problem Relation Age of  Onset  . Family history unknown: Yes     Review of Systems: Review of Systems  Constitutional: Positive for malaise/fatigue. Negative for fever, chills, weight loss and diaphoresis.  HENT: Positive for congestion. Negative for hearing loss and sore throat.   Eyes: Negative for blurred vision, discharge and redness.  Respiratory: Positive for cough, shortness of breath and wheezing. Negative for hemoptysis and sputum production.   Cardiovascular: Negative for chest pain, palpitations, orthopnea, claudication, leg swelling and PND.  Gastrointestinal: Negative for heartburn, nausea, vomiting and abdominal pain.  Musculoskeletal: Negative for myalgias and falls.  Skin: Negative for rash.  Neurological: Negative for dizziness, sensory change, speech change, loss of consciousness, weakness and headaches.  Endo/Heme/Allergies: Does not bruise/bleed easily.  Psychiatric/Behavioral: Negative for depression. The patient is not nervous/anxious.   All other systems reviewed and are negative.   Labs:  Recent Labs  03/23/15  6295 03/23/15 2148  TROPONINI <0.03 <0.03   Lab Results  Component Value Date   WBC 8.8 03/24/2015   HGB 14.5 03/24/2015   HCT 42.3 03/24/2015   MCV 83.1 03/24/2015   PLT 214 03/24/2015     Recent Labs Lab 03/24/15 0452  NA 140  K 3.5  CL 110  CO2 24  BUN 14  CREATININE 0.85  CALCIUM 8.8*  GLUCOSE 168*   No results found for: CHOL, HDL, LDLCALC, TRIG No results found for: DDIMER  Radiology/Studies:  Dg Chest 1 View  03/23/2015   CLINICAL DATA:  Acute onset of shortness of breath and wheezing. Initial encounter.  EXAM: CHEST  1 VIEW  COMPARISON:  Chest radiograph performed earlier today at 4:12 a.m.  FINDINGS: The lungs are well-aerated. Mild vascular congestion is noted. There is no evidence of focal opacification, pleural effusion or pneumothorax.  The cardiomediastinal silhouette is borderline normal in size. No acute osseous abnormalities are seen.   IMPRESSION: Mild vascular congestion noted.  Lungs remain grossly clear.   Electronically Signed   By: Garald Balding M.D.   On: 03/23/2015 22:23   Dg Chest 2 View (if Patient Has Fever And/or Copd)  03/23/2015   CLINICAL DATA:  Acute onset of shortness of breath and nonproductive cough. Difficulty breathing and wheezing. Initial encounter.  EXAM: CHEST  2 VIEW  COMPARISON:  Chest radiograph performed 07/20/2013  FINDINGS: The lungs are well-aerated. Vascular congestion is noted. Increased interstitial markings may reflect minimal interstitial edema. There is no evidence of pleural effusion or pneumothorax.  The heart is borderline normal in size. No acute osseous abnormalities are seen.  IMPRESSION: Vascular congestion noted. Increased interstitial markings may reflect minimal interstitial edema.   Electronically Signed   By: Garald Balding M.D.   On: 03/23/2015 05:25    EKG: NSR with sinus arrhythmia with 1st degree AV block, 83 bpm, occasional PVC, no significant st/t changes    Weights: [facility] Weights   03/23/15 2028 03/23/15 2126  Weight: 240 lb (108.863 kg) 240 lb (108.863 kg)     Physical Exam: Blood pressure 179/103, pulse 99, temperature 98 F (36.7 C), temperature source Oral, resp. rate 16, height 5\' 10"  (1.778 m), weight 240 lb (108.863 kg), SpO2 96 %. Body mass index is 34.44 kg/(m^2). General: Well developed, well nourished, in no acute distress. Head: Normocephalic, atraumatic, sclera non-icteric, no xanthomas, nares are without discharge.  Neck: Negative for carotid bruits. JVD not elevated. Lungs: Bilateral wheezing with faint crackles at the bilateral bases. No rhonchi. Breathing is unlabored. Heart: RRR with S1 S2. No murmurs, rubs, or gallops appreciated. Abdomen: Soft, non-tender, non-distended with normoactive bowel sounds. No hepatomegaly. No rebound/guarding. No obvious abdominal masses. Msk:  Strength and tone appear normal for age. Extremities: No clubbing or  cyanosis. No edema.  Distal pedal pulses are 2+ and equal bilaterally. Neuro: Alert and oriented X 3. No facial asymmetry. No focal deficit. Moves all extremities spontaneously. Psych:  Responds to questions appropriately with a normal affect.    Assessment and Plan:  81 y.o. male with h/o asthma, arthritis, macular degeneration and no previously known cardiac history who presented to [facility] x 2 on 6/23 with increased dyspnea and was diagnosed with asthma exacerbation.  1. SOB: -Improved on nebs, steroids, and azithromycin  -Symptoms appear to be more pulmonary in etiology given his negative BNP x 2, wheezing, recent congestion, improvement with nebulizers, and history of asthma.  -Though other etiologies should be ruled out as  well including PE and bronchitis. With normal LV function and right sided pressures unlikely to be pulmonary HTN or hypertensive heart disease in the setting of his poorly controlled BP -Return of his symptoms may indicate need for longer treatment and antibiotic treatment given his prior smoking history  -He historically has not used Advair Diskus as directed, would use daily as directed to prevent exacerbations -Echo showed normal LV function, no wall motion abnormalities, normal diastolic function, trivial TR, PASP normal  -CTA chest pending to rule out PE  2. Accelerated HTN: -Uncontrolled  -Start lisinopril 20 mg daily and hydralazine 10 mg tid with hold parameters   3. Asthma exacerbation/bronchitis: -See #1   4. Remote tobacco abuse    Melvern Banker, PA-C Pager: [phone] 03/24/2015, 10:05 AM

## 2015-03-24 NOTE — Telephone Encounter (Signed)
Attempted to contact pt regarding discharge from [facility] on 03/24/15. Asked him to call back w/ any questions or concerns regarding discharge instructions or medications.  Advised him of appt w/ Dr. Fletcher Anon 05/15/15 @ 3:00. Asked him to call back if he is unable to keep this appt.

## 2015-03-26 NOTE — Discharge Summary (Signed)
[facility] [place] NAME: Ryan Bentley    MR#:  [id number]  DATE OF BIRTH:  09-Apr-1932  DATE OF ADMISSION:  03/23/2015 ADMITTING PHYSICIAN: Lance Coon, MD  DATE OF DISCHARGE: 03/24/2015  2:05 PM  PRIMARY CARE PHYSICIAN: Lelon Huh, MD  ADMISSION DIAGNOSIS:  Wheezing [R06.2]  DISCHARGE DIAGNOSIS:  Principal Problem:   Asthma exacerbation Active Problems:   Dyspnea on exertion   HTN (hypertension)   Asthma  SECONDARY DIAGNOSIS:   Past Medical History  Diagnosis Date  . Arthritis   . Macular degeneration   . Asthma   . COPD (chronic obstructive pulmonary disease)   . Anemia   . History of echocardiogram     a. 03/2015: a. EF 55-60%, no WMA, nl diastolic fxn, trivial TR, nl PASP  . HTN (hypertension)    HOSPITAL COURSE:  81 y.o. male admitted with shortness of breath and dyspnea on exertion. He improved somewhat with nebs, steroids, and azithromycin. Please see Dr Tobey Grim dictated [facility] & P for further details. Symptoms appear to be more pulmonary in etiology given his negative BNP x 2, wheezing, recent congestion, improvement with nebulizers, and history of asthma. Echo showed normal LV function, no wall motion abnormalities, normal diastolic function, trivial TR, PASP normal. CTA chest neg for PE. Patient was started on fosinopril for Accelerated HTN and was also given script for advair and z-pak for possible Asthma exacerbation/bronchitis.  He was discharged home in stable condition. DISCHARGE CONDITIONS:  Stable CONSULTS OBTAINED:  Treatment Team:  Wellington Hampshire, MD  DRUG ALLERGIES:   Allergies  Allergen Reactions  . Ivp Dye [Iodinated Diagnostic Agents]    DISCHARGE MEDICATIONS:   Discharge Medication List as of 03/24/2015 12:48 PM    START taking these medications   Details  azithromycin (ZITHROMAX) 500 MG tablet Take 1 tablet (500 mg total) by mouth daily., Starting 03/24/2015, Until Discontinued,  Normal    Fluticasone-Salmeterol (ADVAIR) 250-50 MCG/DOSE AEPB Inhale 1 puff into the lungs 2 (two) times daily., Starting 03/24/2015, Until Discontinued, Normal    fosinopril (MONOPRIL) 20 MG tablet Take 1 tablet (20 mg total) by mouth daily., Starting 03/24/2015, Until Discontinued, Normal      CONTINUE these medications which have NOT CHANGED   Details  albuterol (PROVENTIL HFA;VENTOLIN HFA) 108 (90 BASE) MCG/ACT inhaler Inhale 2 puffs into the lungs every 6 (six) hours as needed for wheezing or shortness of breath., Until Discontinued, Historical Med    Fish Oil-Cholecalciferol (FISH OIL + D3 PO) Take 1 capsule by mouth 2 (two) times daily., Until Discontinued, Historical Med    fluticasone (VERAMYST) 27.5 MCG/SPRAY nasal spray Place 2 sprays into the nose daily as needed for rhinitis., Until Discontinued, Historical Med    Multiple Vitamins-Minerals (PRESERVISION AREDS) TABS Take 1 tablet by mouth 2 (two) times daily., Until Discontinued, Historical Med    tamsulosin (FLOMAX) 0.4 MG CAPS capsule Take 0.4 mg by mouth as needed., Until Discontinued, Historical Med       DIET:  Cardiac diet  DISCHARGE CONDITION:  Good  ACTIVITY:  Activity as tolerated  OXYGEN:  Home Oxygen: No.   Oxygen Delivery: room air  DISCHARGE LOCATION:  home   If you experience worsening of your admission symptoms, develop shortness of breath, life threatening emergency, suicidal or homicidal thoughts you must seek medical attention immediately by calling 911 or calling your MD immediately  if symptoms less severe.  You Must read complete instructions/literature along with all  the possible adverse reactions/side effects for all the Medicines you take and that have been prescribed to you. Take any new Medicines after you have completely understood and accpet all the possible adverse reactions/side effects.   Please note  You were cared for by a hospitalist during your hospital stay. If you have any  questions about your discharge medications or the care you received while you were in the hospital after you are discharged, you can call the unit and asked to speak with the hospitalist on call if the hospitalist that took care of you is not available. Once you are discharged, your primary care physician will handle any further medical issues. Please note that NO REFILLS for any discharge medications will be authorized once you are discharged, as it is imperative that you return to your primary care physician (or establish a relationship with a primary care physician if you do not have one) for your aftercare needs so that they can reassess your need for medications and monitor your lab values.    On the day of Discharge:   VITAL SIGNS:  Blood pressure 178/68, pulse 107, temperature 98.1 F (36.7 C), temperature source Oral, resp. rate 18, height 5\' 10"  (1.778 m), weight 108.863 kg (240 lb), SpO2 98 %.  I/O:  No intake or output data in the 24 hours ending 03/26/15 1426  PHYSICAL EXAMINATION:  GENERAL:  81 y.o.-year-old patient lying in the bed with no acute distress.  EYES: Pupils equal, round, reactive to light and accommodation. No scleral icterus. Extraocular muscles intact.  HEENT: Head atraumatic, normocephalic. Oropharynx and nasopharynx clear.  NECK:  Supple, no jugular venous distention. No thyroid enlargement, no tenderness.  LUNGS: Normal breath sounds bilaterally, no wheezing, rales,rhonchi or crepitation. No use of accessory muscles of respiration.  CARDIOVASCULAR: S1, S2 normal. No murmurs, rubs, or gallops.  ABDOMEN: Soft, non-tender, non-distended. Bowel sounds present. No organomegaly or mass.  EXTREMITIES: No pedal edema, cyanosis, or clubbing.  NEUROLOGIC: Cranial nerves II through XII are intact. Muscle strength 5/5 in all extremities. Sensation intact. Gait not checked.  PSYCHIATRIC: The patient is alert and oriented x 3.  SKIN: No obvious rash, lesion, or ulcer.    DATA REVIEW:   CBC  Recent Labs Lab 03/24/15 0452  WBC 8.8  HGB 14.5  HCT 42.3  PLT 214    Chemistries   Recent Labs Lab 03/24/15 0452  NA 140  K 3.5  CL 110  CO2 24  GLUCOSE 168*  BUN 14  CREATININE 0.85  CALCIUM 8.8*    Cardiac Enzymes  Recent Labs Lab 03/23/15 2148  TROPONINI <0.03    Microbiology Results  No results found for this or any previous visit.  RADIOLOGY:  No results found.   Management plans discussed with the patient, family and they are in agreement.  CODE STATUS:  Advance Directive Documentation        Most Recent Value   Type of Advance Directive  Healthcare Power of Attorney, Living will   Pre-existing out of facility DNR order (yellow form or pink MOST form)     "MOST" Form in Place?        TOTAL TIME TAKING CARE OF THIS PATIENT: 55 minutes.    [facility], Domonic Kimball M.D on 03/26/2015 at 2:26 PM  Between 7am to 6pm - Pager - [phone]  After 6pm go to [email or web address] - password [email or web address] [facility]  Office  [phone]  CC: Primary care physician; Lelon Huh, MD Kathlyn Sacramento  MD

## 2015-03-28 DIAGNOSIS — Z86010 Personal history of colonic polyps: Secondary | ICD-10-CM | POA: Insufficient documentation

## 2015-03-28 DIAGNOSIS — G43909 Migraine, unspecified, not intractable, without status migrainosus: Secondary | ICD-10-CM | POA: Insufficient documentation

## 2015-03-28 DIAGNOSIS — N400 Enlarged prostate without lower urinary tract symptoms: Secondary | ICD-10-CM | POA: Insufficient documentation

## 2015-03-28 DIAGNOSIS — J449 Chronic obstructive pulmonary disease, unspecified: Secondary | ICD-10-CM | POA: Insufficient documentation

## 2015-03-28 DIAGNOSIS — L858 Other specified epidermal thickening: Secondary | ICD-10-CM | POA: Insufficient documentation

## 2015-03-28 DIAGNOSIS — Z131 Encounter for screening for diabetes mellitus: Secondary | ICD-10-CM | POA: Insufficient documentation

## 2015-03-28 DIAGNOSIS — Z6835 Body mass index (BMI) 35.0-35.9, adult: Secondary | ICD-10-CM | POA: Insufficient documentation

## 2015-03-28 DIAGNOSIS — R0602 Shortness of breath: Secondary | ICD-10-CM | POA: Insufficient documentation

## 2015-03-29 ENCOUNTER — Inpatient Hospital Stay: Payer: Self-pay | Admitting: Family Medicine

## 2015-03-29 ENCOUNTER — Encounter: Payer: Self-pay | Admitting: Family Medicine

## 2015-03-29 ENCOUNTER — Ambulatory Visit (INDEPENDENT_AMBULATORY_CARE_PROVIDER_SITE_OTHER): Payer: Medicare Other | Admitting: Family Medicine

## 2015-03-29 VITALS — BP 128/64 | HR 68 | Temp 97.9°F | Resp 16 | Ht 70.0 in | Wt 234.0 lb

## 2015-03-29 DIAGNOSIS — IMO0001 Reserved for inherently not codable concepts without codable children: Secondary | ICD-10-CM

## 2015-03-29 DIAGNOSIS — L570 Actinic keratosis: Secondary | ICD-10-CM | POA: Insufficient documentation

## 2015-03-29 DIAGNOSIS — E041 Nontoxic single thyroid nodule: Secondary | ICD-10-CM | POA: Insufficient documentation

## 2015-03-29 DIAGNOSIS — J4520 Mild intermittent asthma, uncomplicated: Secondary | ICD-10-CM | POA: Diagnosis not present

## 2015-03-29 DIAGNOSIS — R030 Elevated blood-pressure reading, without diagnosis of hypertension: Secondary | ICD-10-CM | POA: Diagnosis not present

## 2015-03-29 DIAGNOSIS — K7689 Other specified diseases of liver: Secondary | ICD-10-CM | POA: Diagnosis not present

## 2015-03-29 NOTE — Progress Notes (Signed)
Patient: Ryan Bentley Male    DOB: 24-Aug-1932   81 y.o.   MRN: [id number] Visit Date: 03/29/2015  Today's Provider: Lelon Huh, MD   Chief Complaint  Patient presents with  . Hospitalization Follow-up  . Asthma   Subjective:   Pt is here for a Hospital follow. He was admitted 03/23/15 and d/c'd 03/24/15. Discharge dx was Asthma exacerbation. Pt was started on Fosinopril for accelerated HTN with SBP into the 170s and DBP in the 60-70s, and given rx for Advair and Zpak. HE reports that he is feeling much better. He has a little bit of a dry cough and still has not gotten is strength back, but he is feeling better.   BP Readings from Last 3 Encounters:  03/29/15 128/64  11/30/14 138/60  03/24/15 178/68   Asthma He complains of cough. There is no chest tightness, difficulty breathing, frequent throat clearing, hemoptysis, hoarse voice, shortness of breath, sputum production or wheezing. This is a chronic problem. The problem has been gradually improving. Associated symptoms include malaise/fatigue. Pertinent negatives include no appetite change, chest pain, dyspnea on exertion, ear congestion, ear pain, fever, headaches, heartburn, myalgias, nasal congestion, orthopnea, PND, postnasal drip, rhinorrhea, sneezing, sore throat, sweats, trouble swallowing or weight loss. His past medical history is significant for asthma.  Started after getting skin of kidney bean stuck in throat. Had been using rescue inhaler periodically prior to this episode, but was not helping this time. Has been back on Advair since discharge and breathing is back to baseline.   Thyroid Nodules: Incidental finding of thyroid nodules on chest CT.      Previous Medications   ALBUTEROL (PROAIR HFA) 108 (90 BASE) MCG/ACT INHALER    Inhale into the lungs.   AZITHROMYCIN (ZITHROMAX) 500 MG TABLET    Take 1 tablet (500 mg total) by mouth daily.   FISH OIL-CHOLECALCIFEROL (FISH OIL + D3 PO)    Take 1 capsule by  mouth 2 (two) times daily.   FLUTICASONE (VERAMYST) 27.5 MCG/SPRAY NASAL SPRAY    Place 2 sprays into the nose daily as needed for rhinitis.   FLUTICASONE-SALMETEROL (ADVAIR) 250-50 MCG/DOSE AEPB    Inhale 1 puff into the lungs 2 (two) times daily.   FOSINOPRIL (MONOPRIL) 20 MG TABLET    Take 1 tablet (20 mg total) by mouth daily.   MULTIPLE VITAMINS-MINERALS PO    Take by mouth.   TAMSULOSIN (FLOMAX) 0.4 MG CAPS CAPSULE    Take by mouth.    Review of Systems  Constitutional: Positive for malaise/fatigue and fatigue. Negative for fever, weight loss and appetite change.  HENT: Negative.  Negative for ear pain, hoarse voice, postnasal drip, rhinorrhea, sneezing, sore throat and trouble swallowing.   Eyes: Negative.   Respiratory: Positive for cough. Negative for hemoptysis, sputum production, shortness of breath and wheezing.   Cardiovascular: Negative.  Negative for chest pain, dyspnea on exertion and PND.  Gastrointestinal: Negative.  Negative for heartburn.  Endocrine: Negative.   Genitourinary: Negative.   Musculoskeletal: Negative.  Negative for myalgias.  Skin: Negative.   Allergic/Immunologic: Negative.   Neurological: Negative.  Negative for headaches.  Hematological: Negative.   Psychiatric/Behavioral: Negative.     History  Substance Use Topics  . Smoking status: Former Smoker    Quit date: 09/30/1966  . Smokeless tobacco: Not on file  . Alcohol Use: 0.0 oz/week    0 Standard drinks or equivalent per week     Comment: 1-2 glasses  bourbon in water 3 times per week   Objective:   BP 128/64 mmHg  Pulse 68  Temp(Src) 97.9 F (36.6 C) (Oral)  Resp 16  Ht 5\' 10"  (1.778 m)  Wt 234 lb (106.142 kg)  BMI 33.58 kg/m2  SpO2 97%  Physical Exam   General Appearance:    Alert, cooperative, no distress  Eyes:    PERRL, conjunctiva/corneas clear, EOM's intact       Lungs:     Clear to auscultation bilaterally, respirations unlabored  Heart:    Regular rate and rhythm    Neurologic:   Awake, alert, oriented x 3. No apparent focal neurological           defect.         Assessment & Plan:     1. Asthma, mild intermittent, uncomplicated Greatly improved since dischr  2. Thyroid nodule Incidental finding on recent chest CT.  - US Thyroid ; Future - T4 AND TSH  3. Liver cyst Incidental finding. Appears benign  4. Elevated blood pressure Much better since discharge. Doing well on fosinopril. BP had been normal prior to recent hospitalization. Continue fosinopril for now.   Follow up: No Follow-up on file.      Lelon Huh, MD  [facility]

## 2015-03-30 LAB — T4 AND TSH
T4 TOTAL: 9.7 ug/dL (ref 4.5–12.0)
TSH: 1.31 u[IU]/mL (ref 0.450–4.500)

## 2015-04-05 ENCOUNTER — Ambulatory Visit
Admission: RE | Admit: 2015-04-05 | Discharge: 2015-04-05 | Disposition: A | Discharge status: Home / Self Care | Payer: Medicare Other | Source: Ambulatory Visit | Attending: Family Medicine | Admitting: Family Medicine

## 2015-04-05 DIAGNOSIS — E042 Nontoxic multinodular goiter: Secondary | ICD-10-CM | POA: Diagnosis not present

## 2015-04-05 DIAGNOSIS — E041 Nontoxic single thyroid nodule: Secondary | ICD-10-CM | POA: Diagnosis not present

## 2015-04-08 ENCOUNTER — Other Ambulatory Visit: Payer: Self-pay | Admitting: Family Medicine

## 2015-04-08 DIAGNOSIS — E041 Nontoxic single thyroid nodule: Secondary | ICD-10-CM

## 2015-04-08 NOTE — Progress Notes (Unsigned)
Ultrasound shows a solid nodule on the right side of his thyroid gland that needs further evaluation and possibly a biopsy. Needs referral to ENT to evaluation. Please advise and forward to [facility] for ENT referral.

## 2015-04-10 NOTE — Progress Notes (Unsigned)
Patient was notified of results. Patient expressed understanding. 

## 2015-04-12 ENCOUNTER — Telehealth: Payer: Self-pay | Admitting: Family Medicine

## 2015-04-12 DIAGNOSIS — E041 Nontoxic single thyroid nodule: Secondary | ICD-10-CM

## 2015-04-12 NOTE — Telephone Encounter (Signed)
Order has been entered

## 2015-04-12 NOTE — Telephone Encounter (Signed)
Pt states that he is supposed to be referred to ENT for thyroid nodule.There is no order in Epic,Thanks

## 2015-04-18 ENCOUNTER — Other Ambulatory Visit: Payer: Self-pay | Admitting: Family Medicine

## 2015-04-18 NOTE — Telephone Encounter (Signed)
Patient has 1 refill left. Confirmed by pharmacy.

## 2015-04-18 NOTE — Telephone Encounter (Signed)
Pt contacted office for refill request on the following medications: Advair Diskus 250-50 MCG/DOSE 1 puff 2 times a day.  (Pt received this Rx from [facility] ER).  [facility] mail order.  Fax#[phone].  FM#[phone]

## 2015-04-19 ENCOUNTER — Other Ambulatory Visit: Payer: Self-pay | Admitting: Family Medicine

## 2015-04-19 MED ORDER — FLUTICASONE-SALMETEROL 250-50 MCG/DOSE IN AEPB: 1.0000 | INHALATION_SPRAY | Freq: Two times a day (BID) | RESPIRATORY_TRACT | Status: DC

## 2015-04-20 DIAGNOSIS — Z961 Presence of intraocular lens: Secondary | ICD-10-CM | POA: Diagnosis not present

## 2015-04-20 DIAGNOSIS — H3531 Nonexudative age-related macular degeneration: Secondary | ICD-10-CM | POA: Diagnosis not present

## 2015-04-28 ENCOUNTER — Other Ambulatory Visit: Payer: Self-pay | Admitting: Otolaryngology

## 2015-04-28 DIAGNOSIS — E041 Nontoxic single thyroid nodule: Secondary | ICD-10-CM

## 2015-04-28 DIAGNOSIS — R490 Dysphonia: Secondary | ICD-10-CM | POA: Diagnosis not present

## 2015-05-05 ENCOUNTER — Other Ambulatory Visit: Payer: Self-pay | Admitting: Radiology

## 2015-05-08 ENCOUNTER — Ambulatory Visit
Admission: RE | Admit: 2015-05-08 | Discharge: 2015-05-08 | Disposition: A | Discharge status: Home / Self Care | Payer: Medicare Other | Source: Ambulatory Visit | Attending: Otolaryngology | Admitting: Otolaryngology

## 2015-05-08 DIAGNOSIS — E041 Nontoxic single thyroid nodule: Secondary | ICD-10-CM

## 2015-05-08 DIAGNOSIS — E042 Nontoxic multinodular goiter: Secondary | ICD-10-CM | POA: Insufficient documentation

## 2015-05-08 NOTE — Sedation Documentation (Signed)
Pt  tolerated procedure well.

## 2015-05-08 NOTE — Procedures (Signed)
Informed consent obtained. Under US guidance, FNA of bilateral thyroid nodules was performed. Left nodule was easily visualized and sampled, including Affirma. Right nodule was very deep and visualization was difficult, but samples were obtained. No immediate complications.

## 2015-05-09 LAB — CYTOLOGY - NON PAP

## 2015-05-15 ENCOUNTER — Encounter: Payer: Medicare Other | Admitting: Cardiovascular Disease

## 2015-05-25 DIAGNOSIS — E041 Nontoxic single thyroid nodule: Secondary | ICD-10-CM | POA: Diagnosis not present

## 2015-10-12 IMAGING — CR DG CHEST 2V
1 series · 4 of 4 positions shown · non-contrast
Comparison: none

REASON FOR EXAM: sob
COMMENTS:

[Series 1: pa · 0.17mm/px · 4 of 4 slices shown]
[im 1/4]
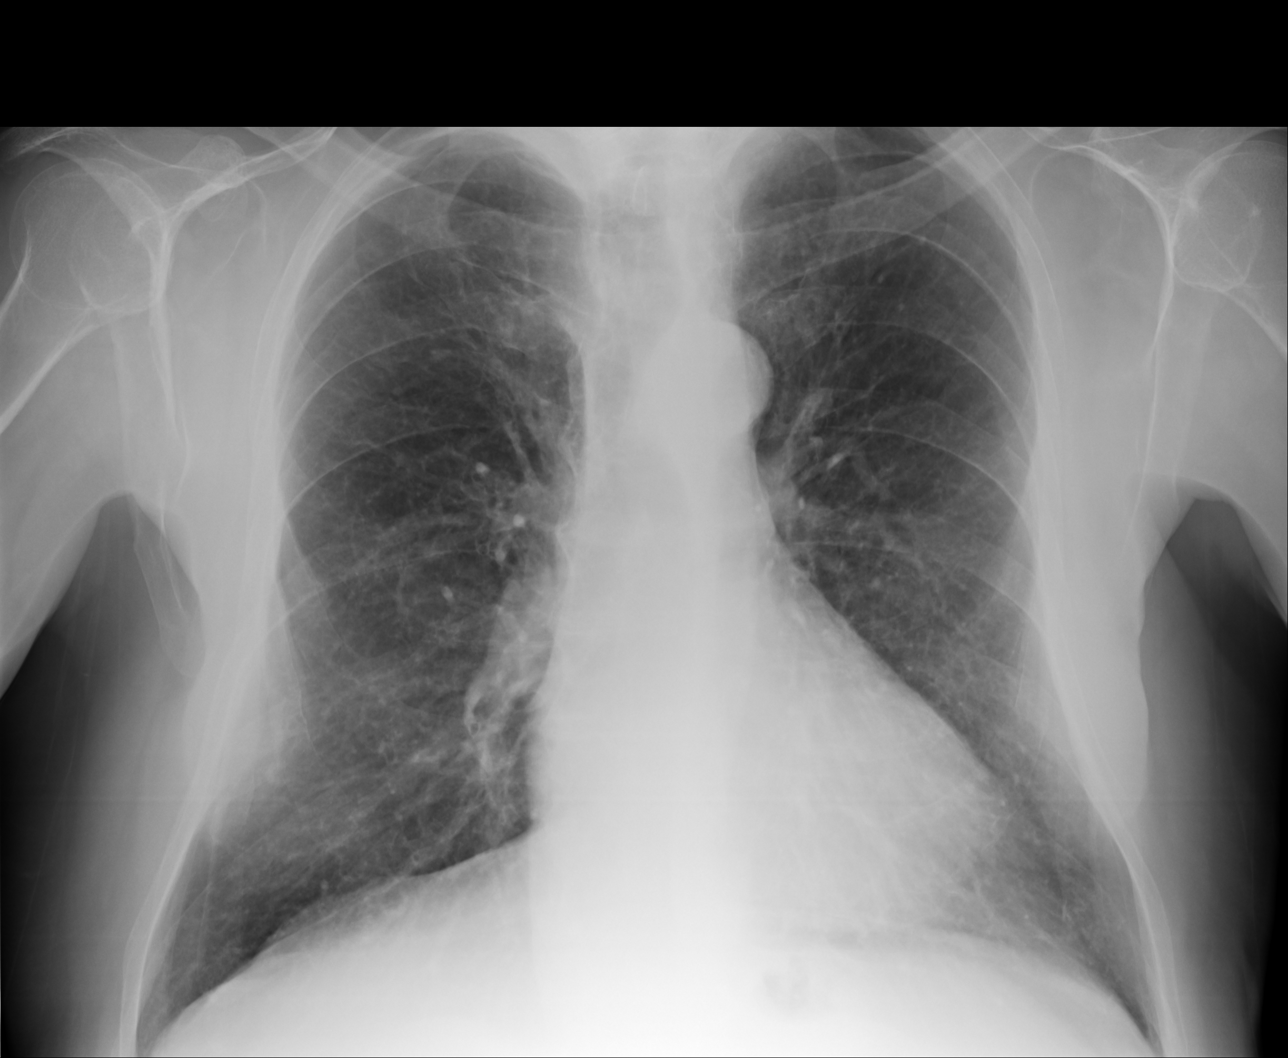
[im 2/4]
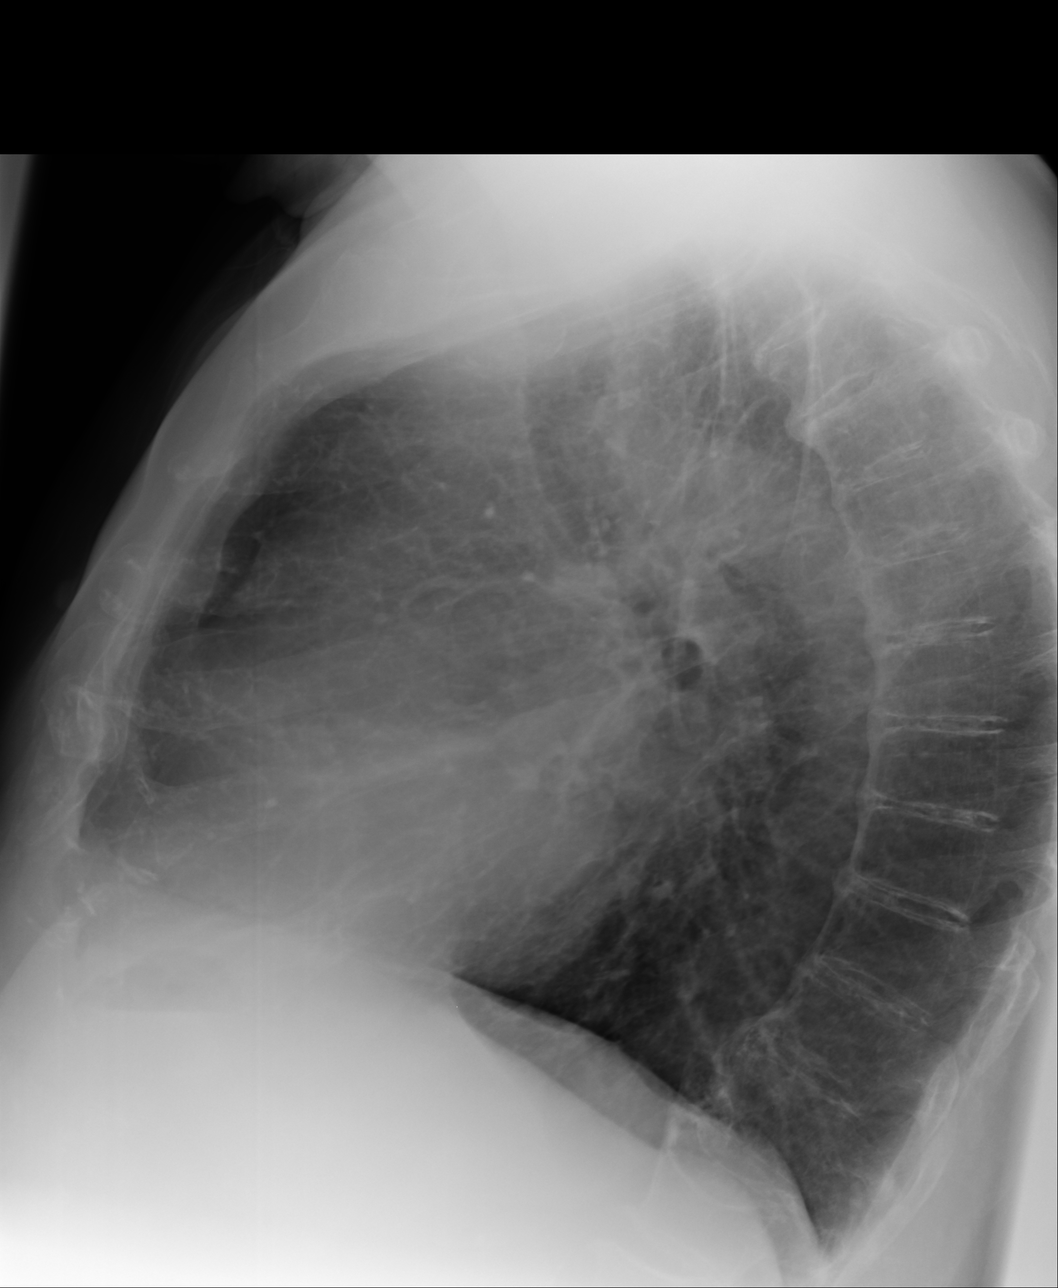
[im 3/4]
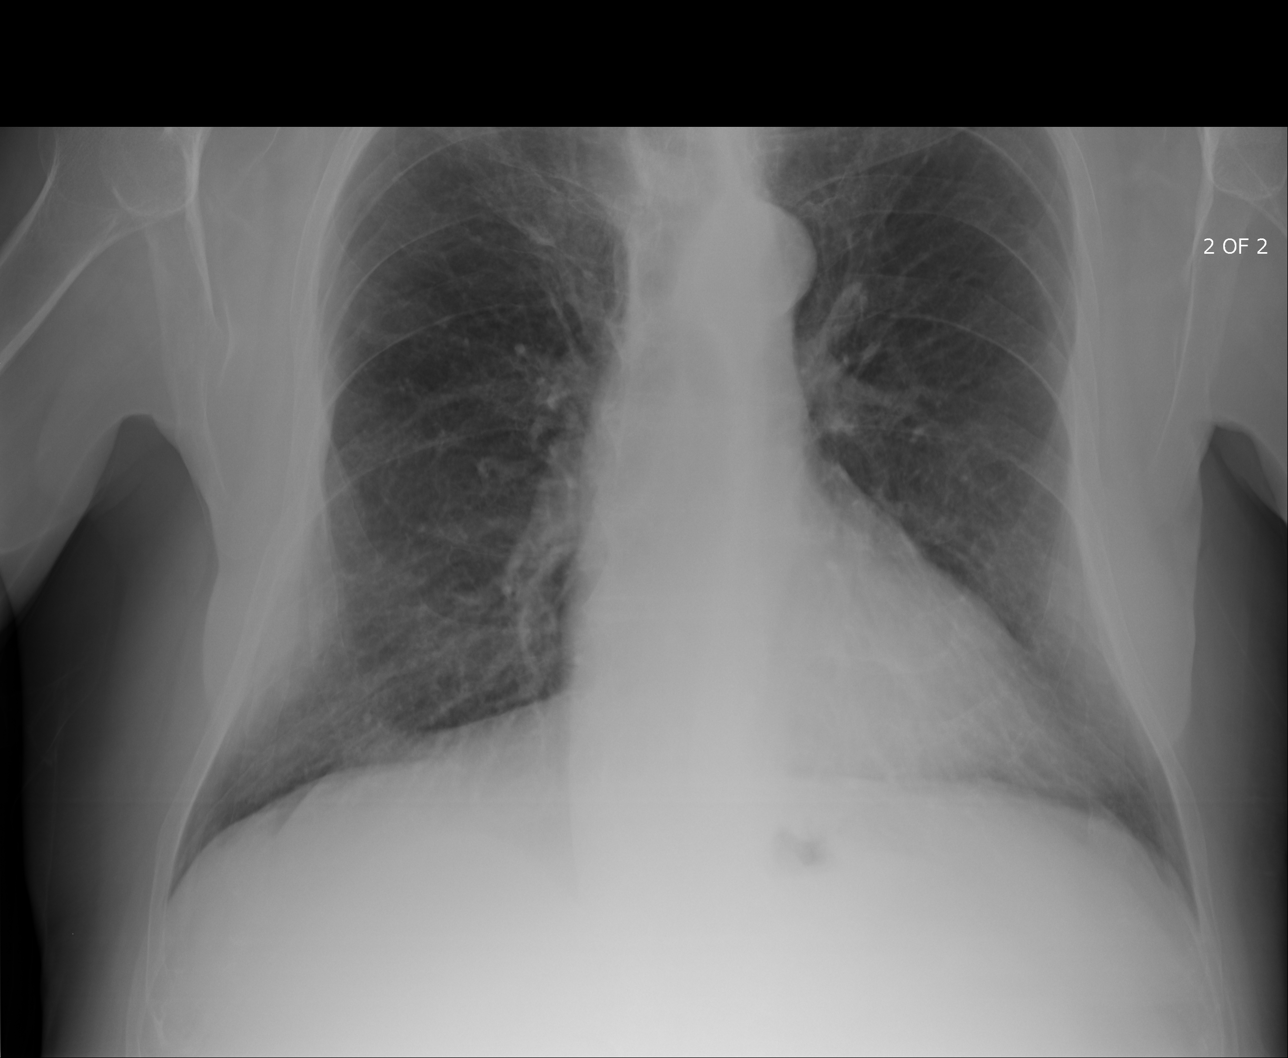
[im 4/4]
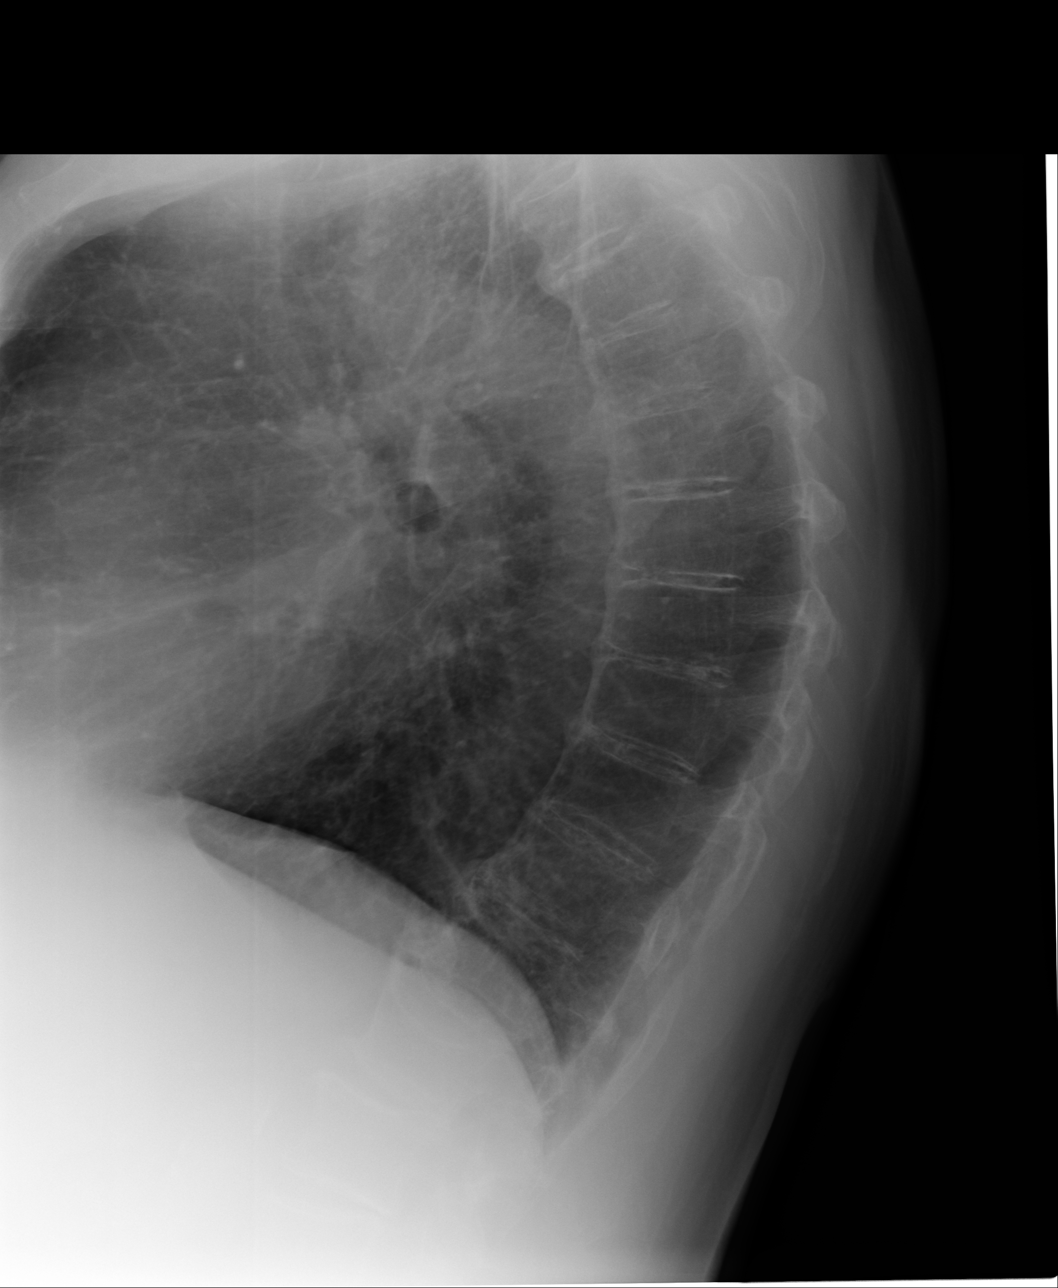

[4 of 4 positions shown; findings below may reference images not displayed]

PROCEDURE:     KDR - KDXR CHEST PA (OR AP) AND LAT  - July 20, 2013 [DATE]

RESULT:     The lungs are hyperinflated consistent with COPD. The lungs are
clear. The heart and pulmonary vessels are normal. The bony and mediastinal
structures are unremarkable. There is no effusion. There is no pneumothorax
or evidence of congestive failure.
IMPRESSION: No acute cardiopulmonary disease.

[REDACTED]

## 2015-12-04 ENCOUNTER — Encounter: Payer: Medicare Other | Admitting: Family Medicine

## 2015-12-06 ENCOUNTER — Ambulatory Visit
Admission: RE | Admit: 2015-12-06 | Discharge: 2015-12-06 | Disposition: A | Discharge status: Home / Self Care | Payer: Medicare Other | Source: Ambulatory Visit | Attending: Family Medicine | Admitting: Family Medicine

## 2015-12-06 ENCOUNTER — Ambulatory Visit (INDEPENDENT_AMBULATORY_CARE_PROVIDER_SITE_OTHER): Payer: Medicare Other | Admitting: Family Medicine

## 2015-12-06 ENCOUNTER — Encounter: Payer: Self-pay | Admitting: Family Medicine

## 2015-12-06 VITALS — BP 140/58 | HR 70 | Temp 97.4°F | Resp 16 | Ht 70.0 in | Wt 238.0 lb

## 2015-12-06 DIAGNOSIS — M25552 Pain in left hip: Secondary | ICD-10-CM | POA: Insufficient documentation

## 2015-12-06 DIAGNOSIS — Z23 Encounter for immunization: Secondary | ICD-10-CM

## 2015-12-06 DIAGNOSIS — Z0000 Encounter for general adult medical examination without abnormal findings: Secondary | ICD-10-CM

## 2015-12-06 DIAGNOSIS — J4520 Mild intermittent asthma, uncomplicated: Secondary | ICD-10-CM | POA: Diagnosis not present

## 2015-12-06 DIAGNOSIS — R739 Hyperglycemia, unspecified: Secondary | ICD-10-CM

## 2015-12-06 DIAGNOSIS — M1612 Unilateral primary osteoarthritis, left hip: Secondary | ICD-10-CM | POA: Diagnosis not present

## 2015-12-06 DIAGNOSIS — Z125 Encounter for screening for malignant neoplasm of prostate: Secondary | ICD-10-CM | POA: Diagnosis not present

## 2015-12-06 DIAGNOSIS — IMO0001 Reserved for inherently not codable concepts without codable children: Secondary | ICD-10-CM

## 2015-12-06 DIAGNOSIS — R030 Elevated blood-pressure reading, without diagnosis of hypertension: Secondary | ICD-10-CM | POA: Diagnosis not present

## 2015-12-06 NOTE — Progress Notes (Signed)
Patient: Ryan Bentley, Male    DOB: October 29, 1931, 82 y.o.   MRN: [id number] Visit Date: 12/06/2015  Today's Provider: Lelon Huh, MD   No chief complaint on file.  Subjective:    Annual wellness visit Ryan Bentley is a 82 y.o. male. He feels fairly well. He reports exercising yes/walking. He reports he is sleeping fairly well.  -----------------------------------------------------------    Follow-up for asthma from 11/30/2014; no changes. Follow-up for BPH from 11/30/2014; restarted fosamax. Follow-up for COPD from 11/30/2014; no changes. Follow-up for elevated blood pressure 03/29/2015; no changes. To continue fosinopril.  Allergies have bothering him for a few days. Having sob, sinus pressure, congestion. No fever.   He has been having increasing pain in his hips when he walks, left greater than right. Pain is on side of hip and groin area. He feels it is limiting his ability to adequately exercise.      Review of Systems  Constitutional: Positive for activity change.  HENT: Positive for congestion, sinus pressure and sneezing.   Eyes: Negative.   Respiratory: Positive for cough and shortness of breath.   Cardiovascular: Negative.   Gastrointestinal: Negative.   Endocrine: Negative.   Genitourinary: Negative.   Musculoskeletal: Positive for gait problem.  Allergic/Immunologic: Positive for environmental allergies.  Neurological: Positive for weakness and numbness.  Hematological: Negative.   Psychiatric/Behavioral: Negative.     Social History   Social History  . Marital Status: Widowed    Spouse Name: N/A  . Number of Children: N/A  . Years of Education: N/A   Occupational History  . Not on file.   Social History Main Topics  . Smoking status: Former Smoker    Quit date: 09/30/1966  . Smokeless tobacco: Not on file  . Alcohol Use: 0.0 oz/week    0 Standard drinks or equivalent per week     Comment: 1-2 glasses bourbon in water 3 times per week   . Drug Use: No  . Sexual Activity: No   Other Topics Concern  . Not on file   Social History Narrative    Past Medical History  Diagnosis Date  . Arthritis   . Macular degeneration   . Asthma   . History of echocardiogram     a. 03/2015: a. EF 55-60%, no WMA, nl diastolic fxn, trivial TR, nl PASP  . History of colon polyps     All benign per patient report  . Migraine headache      Patient Active Problem List   Diagnosis Date Noted  . Actinic keratosis 03/29/2015  . Liver cyst 03/29/2015  . Thyroid nodule 03/29/2015  . BPH (benign prostatic hyperplasia) 03/28/2015  . COPD (chronic obstructive pulmonary disease) ([place]) 03/28/2015  . History of colonic polyps 03/28/2015  . Migraine 03/28/2015  . BMI 34.0-34.9,adult 03/28/2015  . Elevated blood pressure 03/24/2015  . Asthma 03/24/2015  . Obesity 03/23/2015  . Dyspnea on exertion 03/23/2015  . Seborrhea 06/15/2007  . 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 GERD (gastroesophageal reflux disease) 09/30/1998    Past Surgical History  Procedure Laterality Date  . Appendectomy    . Cataract extraction      x2  . Dental surgery  10/2010  . Appendectomy and adenoidectomy  1940's    His Family history is unknown by patient.    Previous Medications   ALBUTEROL (PROAIR HFA) 108 (90 BASE) MCG/ACT INHALER    Inhale into the lungs.   FISH OIL-CHOLECALCIFEROL (  FISH OIL + D3 PO)    Take 1 capsule by mouth 2 (two) times daily.   FLUTICASONE (VERAMYST) 27.5 MCG/SPRAY NASAL SPRAY    Place 2 sprays into the nose daily as needed for rhinitis.   FLUTICASONE-SALMETEROL (ADVAIR) 250-50 MCG/DOSE AEPB    Inhale 1 puff into the lungs 2 (two) times daily.   MULTIPLE VITAMINS-MINERALS PO    Take by mouth. Reported on 12/06/2015   TAMSULOSIN (FLOMAX) 0.4 MG CAPS CAPSULE    Take by mouth.    Patient Care Team: Birdie Sons, MD as PCP - General (Family Medicine)     Objective:   Vitals: BP 140/58 mmHg  Pulse 70   Temp(Src) 97.4 F (36.3 C) (Oral)  Resp 16  Ht 5\' 10"  (1.778 m)  Wt 238 lb (107.956 kg)  BMI 34.15 kg/m2  SpO2 97%  Physical Exam   General Appearance:    Alert, cooperative, no distress, appears stated age  Head:    Normocephalic, without obvious abnormality, atraumatic  Eyes:    PERRL, conjunctiva/corneas clear, EOM's intact, fundi    benign, both eyes       Ears:    Normal TM's and external ear canals, both ears  Nose:   Nares normal, septum midline, mucosa normal, no drainage   or sinus tenderness  Throat:   Lips, mucosa, and tongue normal; teeth and gums normal  Neck:   Supple, symmetrical, trachea midline, no adenopathy;       thyroid:  No enlargement/tenderness/nodules; no carotid   bruit or JVD  Back:     Symmetric, no curvature, ROM normal, no CVA tenderness  Lungs:     Clear to auscultation bilaterally, respirations unlabored  Chest wall:    No tenderness or deformity  Heart:    Regular rate and rhythm, S1 and S2 normal, no murmur, rub   or gallop  Abdomen:     Soft, non-tender, bowel sounds active all four quadrants,    no masses, no organomegaly  Genitalia:    deferred  Rectal:    deferred  Extremities:   Extremities normal, atraumatic, no cyanosis or edema  Pulses:   2+ and symmetric all extremities  Skin:   Skin color, texture, turgor normal, no rashes or lesions  Lymph nodes:   Cervical, supraclavicular, and axillary nodes normal  Neurologic:   CNII-XII intact. Normal strength, sensation and reflexes      throughout    Activities of Daily Living In your present state of health, do you have any difficulty performing the following activities: 03/24/2015  Hearing? N  Vision? N  Difficulty concentrating or making decisions? N  Walking or climbing stairs? N  Dressing or bathing? N  Doing errands, shopping? N    Fall Risk Assessment No flowsheet data found.   Depression Screen No flowsheet data found.  Cognitive Testing - 6-CIT  Correct? Score   What  year is it? yes 0 0 or 4  What month is it? yes 0 0 or 3  Memorize:    [place]ia [place],  44,  [place],      What time is it? (within 1 hour) yes 0 0 or 3  Count backwards from 20 yes 0 0, 2, or 4  Name the months of the year yes 0 0, 2, or 4  Repeat name & address above yes 0 0, 2, 4, 6, 8, or 10       TOTAL SCORE  0/28   Interpretation:  Normal  Normal (0-7) Abnormal (8-28)    Audit-C Alcohol Use Screening  Question Answer Points  How often do you have alcoholic drink? 2-3 times weekly 3  On days you do drink alcohol, how many drinks do you typically consume? 1 or 2 0  How oftey will you drink 6 or more in a total? never 0  Total Score:  3   A score of 3 or more in women, and 4 or more in men indicates increased risk for alcohol abuse, EXCEPT if all of the points are from question 1.     Assessment & Plan:     Annual Wellness Visit  Reviewed patient's Family Medical History Reviewed and updated list of patient's medical providers Assessment of cognitive impairment was done Assessed patient's functional ability Established a written schedule for health screening [place] Completed and Reviewed  Exercise Activities and Dietary recommendations Goals    None      Immunization History  Administered Date(s) Administered  . Pneumococcal Conjugate-13 11/30/2014  . Pneumococcal Polysaccharide-23 08/22/2008    Health Maintenance  Topic Date Due  . TETANUS/TDAP  11/11/1952  . ZOSTAVAX  11/11/1993  . INFLUENZA VACCINE  05/01/2015  . PNA vac Low Risk Adult  Completed      Discussed health benefits of physical activity, and encouraged him to engage in regular exercise appropriate for his age and condition.    --------------------------------------------------------------------------------   1. Medicare annual wellness visit, subsequent   2. Asthma, mild intermittent, uncomplicated Well controlled current inhalers  3.  Hyperglycemia   - Renal function panel - Hemoglobin A1c  4. Elevated blood pressure Much better today. Was prescribed fosinopril at ER last year, but he doesn't remember ever taking it.  - Renal function panel  5. Prostate cancer screening  - PSA  6. Left hip pain Likely OA.  - DG HIP UNILAT WITH PELVIS 2-3 VIEWS LEFT; Future  7. Need for shingles vaccine  - Varicella-zoster vaccine subcutaneous

## 2015-12-06 NOTE — Patient Instructions (Signed)
Go to the [facility] on [address] for left hip Xray  

## 2015-12-07 ENCOUNTER — Telehealth: Payer: Self-pay

## 2015-12-07 ENCOUNTER — Telehealth: Payer: Self-pay | Admitting: Family Medicine

## 2015-12-07 LAB — RENAL FUNCTION PANEL
ALBUMIN: 4.3 g/dL (ref 3.5–4.7)
BUN/Creatinine Ratio: 16 (ref 10–22)
BUN: 15 mg/dL (ref 8–27)
CHLORIDE: 104 mmol/L (ref 96–106)
CO2: 23 mmol/L (ref 18–29)
Calcium: 9.6 mg/dL (ref 8.6–10.2)
Creatinine, Ser: 0.95 mg/dL (ref 0.76–1.27)
GFR, EST AFRICAN AMERICAN: 86 mL/min/{1.73_m2} (ref >59)
GFR, EST NON AFRICAN AMERICAN: 74 mL/min/{1.73_m2} (ref >59)
GLUCOSE: 99 mg/dL (ref 65–99)
PHOSPHORUS: 3.4 mg/dL (ref 2.5–4.5)
POTASSIUM: 5.2 mmol/L (ref 3.5–5.2)
SODIUM: 143 mmol/L (ref 134–144)

## 2015-12-07 LAB — HEMOGLOBIN A1C
Est. average glucose Bld gHb Est-mCnc: 114 mg/dL
Hgb A1c MFr Bld: 5.6 % (ref 4.8–5.6)

## 2015-12-07 LAB — PSA: PROSTATE SPECIFIC AG, SERUM: 1.9 ng/mL (ref 0.0–4.0)

## 2015-12-07 NOTE — Telephone Encounter (Signed)
Pt is returning call.  [phone]

## 2015-12-07 NOTE — Telephone Encounter (Signed)
Patient advised as directed below. Patient verbalized understanding. Patient states he would rather come here for a cortisone injection. Patient scheduled for 12/08/15 at 1:45 pm.

## 2015-12-07 NOTE — Telephone Encounter (Signed)
-----   Message from Birdie Sons, MD sent at 12/07/2015  1:31 PM EST ----- Blood sugar, kidney functions, and PSA are all normal. Xray of hip shows mild arthritis, but probably not enough to cause the degree of pain his is having. I think his pain is from bursitis on the side of his hip, and would probably do well with a cortisone injection. We can do this here, or we can refer him to an orthopedist if he like.

## 2015-12-11 ENCOUNTER — Ambulatory Visit (INDEPENDENT_AMBULATORY_CARE_PROVIDER_SITE_OTHER): Payer: Medicare Other | Admitting: Family Medicine

## 2015-12-11 ENCOUNTER — Encounter: Payer: Self-pay | Admitting: Family Medicine

## 2015-12-11 VITALS — BP 152/60 | HR 64 | Temp 97.7°F | Resp 18 | Wt 239.0 lb

## 2015-12-11 DIAGNOSIS — M461 Sacroiliitis, not elsewhere classified: Secondary | ICD-10-CM | POA: Diagnosis not present

## 2015-12-11 DIAGNOSIS — M7062 Trochanteric bursitis, left hip: Secondary | ICD-10-CM | POA: Diagnosis not present

## 2015-12-11 DIAGNOSIS — M25552 Pain in left hip: Secondary | ICD-10-CM

## 2015-12-11 MED ORDER — METHYLPREDNISOLONE ACETATE 80 MG/ML IJ SUSP: 80.0000 mg | Freq: Once | INTRAMUSCULAR | Status: DC

## 2015-12-11 NOTE — Progress Notes (Signed)
       Patient: Ryan Bentley Male    DOB: 21-Nov-1931   82 y.o.   MRN: [id number] Visit Date: 12/11/2015  Today's Provider: Lelon Huh, MD   Chief Complaint  Patient presents with  . Hip Pain   Subjective:    HPI  Hip Pain:  Patient was last seen 0308/2017 for evaluation of left hip pain. Pain was found to be Bursitis of the side of his hips. Patient is here today to a Cortisone injection.  Patient comes in today stating his hip pain is unchanged since the last office visit. Xray of hip showed mild degenerative narrowing of left hip joint space. He states pain starts in lower back and goes down back and buttocks, in addition to pain left lateral hip.      Allergies  Allergen Reactions  . Iodine Itching  . Ivp Dye [Iodinated Diagnostic Agents]    Previous Medications   ALBUTEROL (PROAIR HFA) 108 (90 BASE) MCG/ACT INHALER    Inhale into the lungs.   FISH OIL-CHOLECALCIFEROL (FISH OIL + D3 PO)    Take 1 capsule by mouth 2 (two) times daily.   FLUTICASONE (VERAMYST) 27.5 MCG/SPRAY NASAL SPRAY    Place 2 sprays into the nose daily as needed for rhinitis.   FLUTICASONE-SALMETEROL (ADVAIR) 250-50 MCG/DOSE AEPB    Inhale 1 puff into the lungs 2 (two) times daily.   MULTIPLE VITAMINS-MINERALS PO    Take by mouth. Reported on 12/06/2015   TAMSULOSIN (FLOMAX) 0.4 MG CAPS CAPSULE    Take by mouth.    Review of Systems  Constitutional: Negative for fever, chills and appetite change.  Respiratory: Negative for chest tightness, shortness of breath and wheezing.   Cardiovascular: Negative for chest pain and palpitations.  Gastrointestinal: Negative for nausea, vomiting and abdominal pain.  Musculoskeletal: Positive for arthralgias (hip pain).    Social History  Substance Use Topics  . Smoking status: Former Smoker    Quit date: 09/30/1966  . Smokeless tobacco: Not on file  . Alcohol Use: 0.0 oz/week    0 Standard drinks or equivalent per week     Comment: 1-2 glasses bourbon in  water 3 times per week   Objective:   BP 152/60 mmHg  Pulse 64  Temp(Src) 97.7 F (36.5 C) (Oral)  Resp 18  Wt 239 lb (108.41 kg)  Physical Exam  MS: Tender over left greater trochanter and slight tenderness over left SI joint.     Assessment & Plan:     1. Left hip pain   2. Trochanteric bursitis of left hip  - methylPREDNISolone acetate (DEPO-MEDROL) injection 80 mg mixed with 54mL 1% lidocaine. Inject over left greater trochanter.   3. Sacroiliitis ([place])  Consider orthopedic referral if not significantly improved after today's steroid injection.        Lelon Huh, MD  [facility]

## 2015-12-11 NOTE — Patient Instructions (Signed)
Call for referral to orthopedist if not much better within one week

## 2015-12-20 ENCOUNTER — Other Ambulatory Visit: Payer: Self-pay | Admitting: Family Medicine

## 2016-04-23 DIAGNOSIS — H353131 Nonexudative age-related macular degeneration, bilateral, early dry stage: Secondary | ICD-10-CM | POA: Diagnosis not present

## 2016-05-08 ENCOUNTER — Encounter: Payer: Self-pay | Admitting: Family Medicine

## 2016-05-08 ENCOUNTER — Ambulatory Visit (INDEPENDENT_AMBULATORY_CARE_PROVIDER_SITE_OTHER): Payer: Medicare Other | Admitting: Family Medicine

## 2016-05-08 VITALS — BP 124/62 | HR 66 | Temp 98.7°F | Resp 17 | Wt 240.0 lb

## 2016-05-08 DIAGNOSIS — M791 Myalgia: Secondary | ICD-10-CM | POA: Diagnosis not present

## 2016-05-08 DIAGNOSIS — M7918 Myalgia, other site: Secondary | ICD-10-CM

## 2016-05-08 MED ORDER — MELOXICAM 15 MG PO TABS: 15.0000 mg | ORAL_TABLET | Freq: Every day | ORAL | 0 refills | Status: DC

## 2016-05-08 NOTE — Patient Instructions (Signed)
Minimize walking until feeling better. May add Tylenol extra strength, two pills 3 x day. Let us know if you are not improving.

## 2016-05-08 NOTE — Progress Notes (Signed)
Subjective:     Patient ID: Ryan Bentley, male   DOB: 11-Aug-1932, 82 y.o.   MRN: [id number]  HPI  Chief Complaint  Patient presents with  . Hip Pain    Patient comes in office today with complaints of left side hip pain for the past week. Patient reports for the past 3 days the pain has become worse. Pateint reports  diffiiculty moving and getting up and down.   States he typically walks for 25 minutes in the AM and picks up his neighbors' papers for them. Reports he can not do this now. Has had a previous trochanteric bursitis injection. Prior x-ray with mild left hip degenerative changes.   Review of Systems  Constitutional: Negative for chills and fever.       Objective:   Physical Exam  Constitutional: He appears well-developed and well-nourished. He appears distressed (mild antalgic gait).  Musculoskeletal:  Localizes tenderness to his left gluteal area. No rash noted. Muscle strength in lower extremities 5/5. SLR's to 90 degrees without radiation of  Pain. Not tender over his trochanteric bursa. Left hip IR/ER without groin pain. Hip abduction/adduction without increased pain.       Assessment:    1. Gluteal pain - meloxicam (MOBIC) 15 MG tablet; Take 1 tablet (15 mg total) by mouth daily.  Dispense: 30 tablet; Refill: 0    Plan:    Discussed use of Tylenol up to 3000 mg/day. F/u is not improving. Monitor for Zostiform rash.

## 2016-05-13 ENCOUNTER — Other Ambulatory Visit: Payer: Self-pay

## 2016-05-13 ENCOUNTER — Telehealth: Payer: Self-pay | Admitting: Emergency Medicine

## 2016-05-13 MED ORDER — FLUTICASONE-SALMETEROL 250-50 MCG/DOSE IN AEPB: 1.0000 | INHALATION_SPRAY | Freq: Two times a day (BID) | RESPIRATORY_TRACT | 4 refills | Status: DC

## 2016-05-13 MED ORDER — ALBUTEROL SULFATE HFA 108 (90 BASE) MCG/ACT IN AERS: 2.0000 | INHALATION_SPRAY | Freq: Four times a day (QID) | RESPIRATORY_TRACT | 3 refills | Status: DC | PRN

## 2016-05-13 NOTE — Telephone Encounter (Signed)
Please advise 

## 2016-05-13 NOTE — Telephone Encounter (Signed)
Pt is requesting a refill on his albuterol inhaler. He has on but it is expired and he is going out of town and would like to make sure he has one on hand incase he starts having breathing difficulty. He is not having any breathing problems right now.   [facility]

## 2016-05-14 ENCOUNTER — Telehealth: Payer: Self-pay | Admitting: Family Medicine

## 2016-05-14 NOTE — Telephone Encounter (Signed)
error 

## 2016-05-30 ENCOUNTER — Encounter: Payer: Self-pay | Admitting: Family Medicine

## 2016-05-30 ENCOUNTER — Ambulatory Visit (INDEPENDENT_AMBULATORY_CARE_PROVIDER_SITE_OTHER): Payer: Medicare Other | Admitting: Family Medicine

## 2016-05-30 VITALS — BP 144/60 | HR 64 | Temp 98.0°F | Resp 17 | Wt 244.6 lb

## 2016-05-30 DIAGNOSIS — M461 Sacroiliitis, not elsewhere classified: Secondary | ICD-10-CM

## 2016-05-30 DIAGNOSIS — M7062 Trochanteric bursitis, left hip: Secondary | ICD-10-CM | POA: Diagnosis not present

## 2016-05-30 DIAGNOSIS — J441 Chronic obstructive pulmonary disease with (acute) exacerbation: Secondary | ICD-10-CM | POA: Diagnosis not present

## 2016-05-30 MED ORDER — PREDNISONE 20 MG PO TABS: ORAL_TABLET | ORAL | 0 refills | Status: DC

## 2016-05-30 MED ORDER — DOXYCYCLINE HYCLATE 100 MG PO TABS: 100.0000 mg | ORAL_TABLET | Freq: Two times a day (BID) | ORAL | 0 refills | Status: DC

## 2016-05-30 NOTE — Patient Instructions (Signed)
Schedule albuterol at least twice a day and add an expectorant like Mucinex. If hip not feeling better call for orthopedic referral.

## 2016-05-30 NOTE — Progress Notes (Signed)
Subjective:     Patient ID: Ryan Bentley, male   DOB: Aug 02, 1932, 82 y.o.   MRN: [id number]  HPI  Chief Complaint  Patient presents with  . Cough    Patient comes into office to address productive cough for the past 2-3 weeks. Patient reports that cough is productive of yellow sputum. Associated symptoms include: wheezing, difficulty sleeping and shortness of breath  . Hip Pain    Patient comes in office today to address left sided hip pain that began 3 weeks ago, patient reports that he was seen in office but his symptoms have not improved.   Report compliance with inhalers and meloxicam/tylenol for his gluteal/hip pains.Previous left hip/pelvis x-ray in March with mild degenerative changes.   Review of Systems     Objective:   Physical Exam  Constitutional: He appears well-developed and well-nourished. No distress.  Pulmonary/Chest: Breath sounds normal. He has no wheezes.  Musculoskeletal:  Mildly tender in his left SI and trochanteric areas. SLR to 80 degrees without discomfort. Increased left lateral hip pain with IR/ER.       Assessment:    1. Sacroiliitis (HCC) - predniSONE (DELTASONE) 20 MG tablet; Taper as follows: 3 pills for 4 days, two pills for 4 days, one pill for four days  Dispense: 24 tablet; Refill: 0  2. Trochanteric bursitis of left hip - predniSONE (DELTASONE) 20 MG tablet; Taper as follows: 3 pills for 4 days, two pills for 4 days, one pill for four days  Dispense: 24 tablet; Refill: 0  3. COPD exacerbation (HCC) - predniSONE (DELTASONE) 20 MG tablet; Taper as follows: 3 pills for 4 days, two pills for 4 days, one pill for four days  Dispense: 24 tablet; Refill: 0 - doxycycline (VIBRA-TABS) 100 MG tablet; Take 1 tablet (100 mg total) by mouth 2 (two) times daily.  Dispense: 20 tablet; Refill: 0    Plan:    Discussed scheduling albuterol and adding expectorants. Call for orthopedic referral if hip and lower back not improved.

## 2016-06-04 ENCOUNTER — Other Ambulatory Visit: Payer: Self-pay | Admitting: Family Medicine

## 2016-06-04 DIAGNOSIS — M7918 Myalgia, other site: Secondary | ICD-10-CM

## 2016-06-04 NOTE — Telephone Encounter (Signed)
See refill request. He is currently on a prednisone taper

## 2016-06-14 ENCOUNTER — Encounter: Payer: Self-pay | Admitting: Family Medicine

## 2016-06-14 ENCOUNTER — Ambulatory Visit (INDEPENDENT_AMBULATORY_CARE_PROVIDER_SITE_OTHER): Payer: Medicare Other | Admitting: Family Medicine

## 2016-06-14 VITALS — BP 130/64 | HR 72 | Temp 97.5°F | Resp 16 | Wt 240.0 lb

## 2016-06-14 DIAGNOSIS — M7062 Trochanteric bursitis, left hip: Secondary | ICD-10-CM

## 2016-06-14 DIAGNOSIS — M461 Sacroiliitis, not elsewhere classified: Secondary | ICD-10-CM

## 2016-06-14 MED ORDER — PREDNISONE 10 MG PO TABS: ORAL_TABLET | ORAL | 0 refills | Status: AC

## 2016-06-14 NOTE — Progress Notes (Signed)
Patient: Ryan Bentley Male    DOB: 10/03/1931   82 y.o.   MRN: [id number] Visit Date: 06/14/2016  Today's Provider: Lelon Huh, MD   Chief Complaint  Patient presents with  . Follow-up    left hip pain   Subjective:    HPI  Follow up for left hip pain  The patient was last seen for this 05/30/2016 by Mikki Santee. Changes made at last visit include started on Prednisone taper x 10 days and symptoms resolved immediately. Breathing also greatly improved.   He reports excellent compliance with treatment. He feels that condition is Unchanged. He is not having side effects. Patient reports that pain is worsening last couple days of prednisone. Patient reports pain is worse in the mornings. Patient reports pain radiates down to left knee.  Had similar episode in march which responded well to steroid injection over greater trochanter, but pain is now mostly in SI joint.   ------------------------------------------------------------------------------------       Allergies  Allergen Reactions  . Iodine Itching  . Ivp Dye [Iodinated Diagnostic Agents]      Current Outpatient Prescriptions:  .  albuterol (PROAIR HFA) 108 (90 Base) MCG/ACT inhaler, Inhale 2 puffs into the lungs every 6 (six) hours as needed for wheezing or shortness of breath., Disp: 1 Inhaler, Rfl: 3 .  Fish Oil-Cholecalciferol (FISH OIL + D3 PO), Take 1 capsule by mouth 2 (two) times daily., Disp: , Rfl:  .  fluticasone (VERAMYST) 27.5 MCG/SPRAY nasal spray, Place 2 sprays into the nose daily as needed for rhinitis., Disp: , Rfl:  .  Fluticasone-Salmeterol (ADVAIR) 250-50 MCG/DOSE AEPB, Inhale 1 puff into the lungs 2 (two) times daily., Disp: 3 each, Rfl: 4 .  meloxicam (MOBIC) 15 MG tablet, Take 1 tablet (15 mg total) by mouth daily as needed for pain., Disp: 2 tablet, Rfl: 1 .  MULTIPLE VITAMINS-MINERALS PO, Take by mouth. Reported on 12/06/2015, Disp: , Rfl:  .  tamsulosin (FLOMAX) 0.4 MG CAPS capsule,  TAKE 1 CAPSULE EVERY DAY, Disp: 30 capsule, Rfl: 12  Review of Systems  Constitutional: Negative.   HENT: Positive for congestion.   Musculoskeletal: Positive for joint swelling.    Social History  Substance Use Topics  . Smoking status: Former Smoker    Quit date: 09/30/1966  . Smokeless tobacco: Not on file  . Alcohol use 0.0 oz/week     Comment: 1-2 glasses bourbon in water 3 times per week   Objective:   BP 130/64 (BP Location: Left Arm, Patient Position: Sitting, Cuff Size: Large)   Pulse 72   Temp 97.5 F (36.4 C) (Oral)   Resp 16   Wt 240 lb (108.9 kg)   SpO2 99%   BMI 34.44 kg/m   Physical Exam   Physical Exam  Constitutional: He appears well-developed and well-nourished. No distress.  MS: Mildly tender in his left SI and to a lesser extent in left trochanteric area. SLR to 80 degrees without discomfort. Increased left lateral hip pain with IR/ER.     Assessment & Plan:     1. Trochanteric bursitis of left hip   2. Sacroiliitis ([place]) Initially improved greatly on prednisone. Will start back on 60mg  and slowly taper. Advised we could refer orthopedics for SI injection if needed.  - predniSONE (DELTASONE) 10 MG tablet; 6 tablets for 3 days, then 5 for 3 days, then 4 for 3 days, then 3 for 3 days, then 2 for 3 days, then 1  until gone  Dispense: 75 tablet; Refill: 0       Lelon Huh, MD  [facility]

## 2016-06-17 ENCOUNTER — Ambulatory Visit: Payer: Medicare Other | Admitting: Family Medicine

## 2016-06-26 ENCOUNTER — Ambulatory Visit
Admission: RE | Admit: 2016-06-26 | Discharge: 2016-06-26 | Disposition: A | Discharge status: Home / Self Care | Payer: Medicare Other | Source: Ambulatory Visit | Attending: Family Medicine | Admitting: Family Medicine

## 2016-06-26 ENCOUNTER — Ambulatory Visit (INDEPENDENT_AMBULATORY_CARE_PROVIDER_SITE_OTHER): Payer: Medicare Other | Admitting: Family Medicine

## 2016-06-26 VITALS — BP 142/58 | HR 82 | Temp 98.2°F | Resp 14 | Wt 237.0 lb

## 2016-06-26 DIAGNOSIS — K529 Noninfective gastroenteritis and colitis, unspecified: Secondary | ICD-10-CM

## 2016-06-26 DIAGNOSIS — R197 Diarrhea, unspecified: Secondary | ICD-10-CM | POA: Diagnosis not present

## 2016-06-26 NOTE — Patient Instructions (Signed)
Can use over the counter Lomotil for diarrhea as needed.

## 2016-06-26 NOTE — Progress Notes (Signed)
Ryan Bentley  MRN: [id number] DOB: 09-16-1932  Subjective:  HPI  Patient has had trouble with diarrhea for the past 5 days. He has several bowel movements a day he does not count and at night he has 3 to 4 runny bowel movebnets. He has not changed his eating habits. No nausea, no abdominal pain or any other issues related to this. He feels ok overall. He has not tried any medications for his current symptoms.  Patient Active Problem List   Diagnosis Date Noted  . Actinic keratosis 03/29/2015  . Liver cyst 03/29/2015  . Thyroid nodule 03/29/2015  . BPH (benign prostatic hyperplasia) 03/28/2015  . COPD (chronic obstructive pulmonary disease) ([place]) 03/28/2015  . History of colonic polyps 03/28/2015  . Migraine 03/28/2015  . BMI 34.0-34.9,adult 03/28/2015  . Elevated blood pressure 03/24/2015  . Asthma 03/24/2015  . Obesity 03/23/2015  . Dyspnea on exertion 03/23/2015  . Seborrhea 06/15/2007  . 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 GERD (gastroesophageal reflux disease) 09/30/1998    Past Medical History:  Diagnosis Date  . Arthritis   . Asthma   . History of colon polyps    All benign per patient report  . History of echocardiogram    a. 03/2015: a. EF 55-60%, no WMA, nl diastolic fxn, trivial TR, nl PASP  . Macular degeneration   . Migraine headache     Social History   Social History  . Marital status: Widowed    Spouse name: N/A  . Number of children: N/A  . Years of education: N/A   Occupational History  . Not on file.   Social History Main Topics  . Smoking status: Former Smoker    Quit date: 09/30/1966  . Smokeless tobacco: Not on file  . Alcohol use 0.0 oz/week     Comment: 1-2 glasses bourbon in water 3 times per week  . Drug use: No  . Sexual activity: No   Other Topics Concern  . Not on file   Social History Narrative  . No narrative on file    Outpatient Encounter Prescriptions as of 06/26/2016  Medication Sig Note    . albuterol (PROAIR HFA) 108 (90 Base) MCG/ACT inhaler Inhale 2 puffs into the lungs every 6 (six) hours as needed for wheezing or shortness of breath.   . Fish Oil-Cholecalciferol (FISH OIL + D3 PO) Take 1 capsule by mouth 2 (two) times daily.   . fluticasone (VERAMYST) 27.5 MCG/SPRAY nasal spray Place 2 sprays into the nose daily as needed for rhinitis.   . Fluticasone-Salmeterol (ADVAIR) 250-50 MCG/DOSE AEPB Inhale 1 puff into the lungs 2 (two) times daily.   . meloxicam (MOBIC) 15 MG tablet Take 1 tablet (15 mg total) by mouth daily as needed for pain.   Marland Kitchen MULTIPLE VITAMINS-MINERALS PO Take by mouth. Reported on 12/06/2015 03/28/2015: Received from: [facility]  . predniSONE (DELTASONE) 10 MG tablet 6 tablets for 3 days, then 5 for 3 days, then 4 for 3 days, then 3 for 3 days, then 2 for 3 days, then 1 until gone   . tamsulosin (FLOMAX) 0.4 MG CAPS capsule TAKE 1 CAPSULE EVERY DAY    No facility-administered encounter medications on file as of 06/26/2016.     Allergies  Allergen Reactions  . Iodine Itching  . Ivp Dye [Iodinated Diagnostic Agents]     Review of Systems  Constitutional: Negative.   Eyes: Negative.   Respiratory: Positive for shortness of breath.  Cardiovascular: Negative.   Gastrointestinal: Positive for diarrhea. Negative for abdominal pain, heartburn, nausea and vomiting.  Musculoskeletal: Positive for joint pain (hip pain).  Skin: Negative.   Endo/Heme/Allergies: Negative.   Psychiatric/Behavioral: Negative.    Objective:  BP (!) 142/58   Pulse 82   Temp 98.2 F (36.8 C)   Resp 14   Wt 237 lb (107.5 kg)   BMI 34.01 kg/m   Physical Exam  Constitutional: He is oriented to person, place, and time and well-developed, well-nourished, and in no distress.  HENT:  Head: Normocephalic and atraumatic.  Eyes: Conjunctivae are normal. No scleral icterus.  Neck: No thyromegaly present.  Cardiovascular: Normal rate, regular rhythm and normal heart  sounds.   Pulmonary/Chest: Effort normal and breath sounds normal.  Abdominal: Soft. Bowel sounds are normal. He exhibits no distension and no mass. There is no tenderness. There is no rebound and no guarding.  Musculoskeletal: He exhibits no edema.  Lymphadenopathy:    He has no cervical adenopathy.  Neurological: He is alert and oriented to person, place, and time.  Skin: Skin is warm and dry.  Psychiatric: Mood, memory, affect and judgment normal.    Assessment and Plan :  1. Enteritis/Most likely viral Get KUB. Try OTC Lomotil. Follow. - DG Abd 1 View; Future Follow-up if this worsens or does not improve over the next few days. Push fluids to avoid dehydration. HPI, Exam and A&P transcribed under direction and in the presence of Miguel Aschoff, MD. I have done the exam and reviewed the chart and it is accurate to the best of my knowledge. Miguel Aschoff M.D. [facility]

## 2016-07-08 ENCOUNTER — Telehealth: Payer: Self-pay | Admitting: Family Medicine

## 2016-07-08 DIAGNOSIS — M25552 Pain in left hip: Secondary | ICD-10-CM

## 2016-07-08 NOTE — Telephone Encounter (Signed)
Pt states he seen Dr Rosanna Randy 06/26/16 for diarrhea.  Pt states he is still having the same issue.  Pt states he is not really able to leave home.    Pt is also stating he is still left hip pain.  Pt states he is walking with a cane to keep from falling.    Pt is requesting a call back from Dr Caryn Section about both issues.  [phone]

## 2016-07-08 NOTE — Telephone Encounter (Signed)
Please review. KW 

## 2016-07-09 NOTE — Telephone Encounter (Signed)
Advised patient as below. Patient reports that the injections did not help. He felt that he received more relief with oral prednisone. He wants to proceed with Ortho referral. Order has been placed.

## 2016-07-09 NOTE — Telephone Encounter (Signed)
Have sent in prescription for metronidazole to take for a week. He can also take OTC Immodium as directed on label.  If this does not salve problem he will need to return for further tests.   If steroid injection didn't help his hip then he will need referral to orthopedics. If it did help he could come back for another injection.

## 2016-07-10 ENCOUNTER — Telehealth: Payer: Self-pay | Admitting: Family Medicine

## 2016-07-10 MED ORDER — METRONIDAZOLE 500 MG PO TABS: 500.0000 mg | ORAL_TABLET | Freq: Two times a day (BID) | ORAL | 0 refills | Status: DC

## 2016-07-10 NOTE — Telephone Encounter (Signed)
Dr. Caryn Section, can you send in metronidazole for the patient? The patient went to the pharmacy and it was not there. If you provide the SIG, I can send it in for you. Thanks!

## 2016-07-10 NOTE — Telephone Encounter (Signed)
[facility] [address] [facility] have not received a prescription that was prescribed for diarrhea.He was told by our office this had been sent in

## 2016-07-22 DIAGNOSIS — M7062 Trochanteric bursitis, left hip: Secondary | ICD-10-CM | POA: Diagnosis not present

## 2016-07-24 DIAGNOSIS — M25552 Pain in left hip: Secondary | ICD-10-CM | POA: Diagnosis not present

## 2016-07-24 DIAGNOSIS — M7062 Trochanteric bursitis, left hip: Secondary | ICD-10-CM | POA: Diagnosis not present

## 2016-07-25 DIAGNOSIS — Z23 Encounter for immunization: Secondary | ICD-10-CM | POA: Diagnosis not present

## 2016-07-26 DIAGNOSIS — M7062 Trochanteric bursitis, left hip: Secondary | ICD-10-CM | POA: Diagnosis not present

## 2016-07-26 DIAGNOSIS — M25552 Pain in left hip: Secondary | ICD-10-CM | POA: Diagnosis not present

## 2016-07-30 DIAGNOSIS — M25552 Pain in left hip: Secondary | ICD-10-CM | POA: Diagnosis not present

## 2016-07-30 DIAGNOSIS — M7062 Trochanteric bursitis, left hip: Secondary | ICD-10-CM | POA: Diagnosis not present

## 2016-08-01 DIAGNOSIS — M7062 Trochanteric bursitis, left hip: Secondary | ICD-10-CM | POA: Diagnosis not present

## 2016-08-01 DIAGNOSIS — M25552 Pain in left hip: Secondary | ICD-10-CM | POA: Diagnosis not present

## 2016-08-02 ENCOUNTER — Telehealth: Payer: Self-pay | Admitting: Family Medicine

## 2016-08-02 MED ORDER — METRONIDAZOLE 500 MG PO TABS: 500.0000 mg | ORAL_TABLET | Freq: Two times a day (BID) | ORAL | 0 refills | Status: AC

## 2016-08-02 NOTE — Telephone Encounter (Signed)
Patient has been advised. KW 

## 2016-08-02 NOTE — Telephone Encounter (Signed)
Have sent rf for metronidazole to [facility]. O.v. If not much better within a week.

## 2016-08-02 NOTE — Telephone Encounter (Signed)
Please advise 

## 2016-08-02 NOTE — Telephone Encounter (Signed)
Pt called saying he was in several weeks ago with chronic diarrhea and was given what he thinks was an antibiotic.  He took that for 10 days and got better.    He is now having the chronic diarrhea again.  It is not as bad as it was but he states his stools are frequent and watery.  Please advise  [phone]  Thanks Con Memos

## 2016-08-19 ENCOUNTER — Telehealth: Payer: Self-pay | Admitting: Family Medicine

## 2016-08-19 ENCOUNTER — Other Ambulatory Visit: Payer: Self-pay | Admitting: *Deleted

## 2016-08-19 DIAGNOSIS — R197 Diarrhea, unspecified: Secondary | ICD-10-CM

## 2016-08-19 NOTE — Telephone Encounter (Signed)
Please advise 

## 2016-08-19 NOTE — Addendum Note (Signed)
Addended by: Julieta Bellini on: 08/19/2016 03:47 PM   Modules accepted: Orders

## 2016-08-19 NOTE — Addendum Note (Signed)
Addended by: Julieta Bellini on: 08/19/2016 04:03 PM   Modules accepted: Orders

## 2016-08-19 NOTE — Telephone Encounter (Signed)
Ok to refer to GI for persistent diarrhea, but also recommend going ahead and doing stool tests while waiting to see GI.  Need stool ova and parasites, culture % sensitivity, C. Diff toxins, and fecal leukocytes.

## 2016-08-19 NOTE — Telephone Encounter (Signed)
Please schedule referral to GI for persistent diarrhea? Thanks!

## 2016-08-19 NOTE — Telephone Encounter (Signed)
Pt is still having problems with diarrhea.  He has been treated twice with antibiotics.  He wants to get a referral to gastro.  Call back is [phone]  Thanks, Con Memos

## 2016-08-21 ENCOUNTER — Other Ambulatory Visit: Payer: Self-pay | Admitting: Family Medicine

## 2016-08-21 DIAGNOSIS — R197 Diarrhea, unspecified: Secondary | ICD-10-CM | POA: Diagnosis not present

## 2016-08-23 ENCOUNTER — Other Ambulatory Visit: Payer: Self-pay | Admitting: Family Medicine

## 2016-08-23 DIAGNOSIS — A0472 Enterocolitis due to Clostridium difficile, not specified as recurrent: Secondary | ICD-10-CM

## 2016-08-23 HISTORY — DX: Enterocolitis due to Clostridium difficile, not specified as recurrent: A04.72

## 2016-08-23 MED ORDER — METRONIDAZOLE 500 MG PO TABS: 500.0000 mg | ORAL_TABLET | Freq: Three times a day (TID) | ORAL | 0 refills | Status: DC

## 2016-08-28 LAB — OVA AND PARASITE EXAMINATION

## 2016-08-28 LAB — STOOL CULTURE: E coli, Shiga toxin Assay: NEGATIVE

## 2016-08-28 LAB — FECAL LEUKOCYTES

## 2016-08-28 LAB — CLOSTRIDIUM DIFFICILE EIA: C difficile Toxins A+B, EIA: POSITIVE — AB

## 2016-08-29 ENCOUNTER — Encounter: Payer: Self-pay | Admitting: Gastroenterology

## 2016-08-29 ENCOUNTER — Ambulatory Visit (INDEPENDENT_AMBULATORY_CARE_PROVIDER_SITE_OTHER): Payer: Medicare Other | Admitting: Gastroenterology

## 2016-08-29 VITALS — BP 150/75 | HR 68 | Temp 98.9°F | Ht 70.0 in | Wt 225.0 lb

## 2016-08-29 DIAGNOSIS — A09 Infectious gastroenteritis and colitis, unspecified: Secondary | ICD-10-CM

## 2016-08-29 DIAGNOSIS — A0472 Enterocolitis due to Clostridium difficile, not specified as recurrent: Secondary | ICD-10-CM | POA: Diagnosis not present

## 2016-08-29 MED ORDER — METRONIDAZOLE 500 MG PO TABS: 500.0000 mg | ORAL_TABLET | Freq: Three times a day (TID) | ORAL | 0 refills | Status: AC

## 2016-08-29 NOTE — Progress Notes (Signed)
Gastroenterology Consultation  Referring Provider:     Birdie Sons, MD Primary Care Physician:  Lelon Huh, MD Primary Gastroenterologist:  Dr. Allen Norris     Reason for Consultation:     Diarrhea        HPI:   Ryan Bentley is a 82 y.o. y/o male referred for consultation & management of Diarrhea by Dr. Lelon Huh, MD.  This patient comes today after reporting a couple months of diarrhea. The patient is not sure when he was treated with but states he was treated with antibiotics and steroids. He is not sure if these were for his diarrhea or not. The patient is also not sure if the diarrhea started after he was treated with antibiotics. His most recent stool test showed him to have C. difficile positive. The patient has been taking the Flagyl and states he has 3 more days left of it. He states that his diarrhea has pretty much gone away and when it. He does have the diarrhea he takes Imodium. There is no report of any unexplained weight loss, black stools or bloody stools.  Past Medical History:  Diagnosis Date  . Arthritis   . Asthma   . History of colon polyps    All benign per patient report  . History of echocardiogram    a. 03/2015: a. EF 55-60%, no WMA, nl diastolic fxn, trivial TR, nl PASP  . Macular degeneration   . Migraine headache     Past Surgical History:  Procedure Laterality Date  . APPENDECTOMY    . appendectomy and adenoidectomy  1940's  . CATARACT EXTRACTION     x2  . DENTAL SURGERY  10/2010    Prior to Admission medications   Medication Sig Start Date End Date Taking? Authorizing Provider  albuterol (PROAIR HFA) 108 (90 Base) MCG/ACT inhaler Inhale 2 puffs into the lungs every 6 (six) hours as needed for wheezing or shortness of breath. 05/13/16  Yes Birdie Sons, MD  Fish Oil-Cholecalciferol (FISH OIL + D3 PO) Take 1 capsule by mouth 2 (two) times daily.   Yes Historical Provider, MD  fluticasone (VERAMYST) 27.5 MCG/SPRAY nasal spray Place 2  sprays into the nose daily as needed for rhinitis.   Yes Historical Provider, MD  Fluticasone-Salmeterol (ADVAIR) 250-50 MCG/DOSE AEPB Inhale 1 puff into the lungs 2 (two) times daily. 05/13/16  Yes Birdie Sons, MD  metroNIDAZOLE (FLAGYL) 500 MG tablet Take 1 tablet (500 mg total) by mouth 3 (three) times daily. 08/29/16 09/12/16 Yes Lucilla Lame, MD  MULTIPLE VITAMINS-MINERALS PO Take by mouth. Reported on 12/06/2015   Yes Historical Provider, MD  tamsulosin (FLOMAX) 0.4 MG CAPS capsule TAKE 1 CAPSULE EVERY DAY 12/20/15  Yes Birdie Sons, MD    Family History  Problem Relation Age of Onset  . Heart disease Mother      Social History  Substance Use Topics  . Smoking status: Former Smoker    Quit date: 09/30/1966  . Smokeless tobacco: Never Used  . Alcohol use 0.0 oz/week     Comment: 6 Glasses Wine or Bourbon / Week    Allergies as of 08/29/2016 - Review Complete 08/29/2016  Allergen Reaction Noted  . Iodine Itching 03/28/2015  . Ivp dye [iodinated diagnostic agents] Anxiety 03/23/2015    Review of Systems:    All systems reviewed and negative except where noted in HPI.   Physical Exam:  BP (!) 150/75   Pulse 68   Temp 98.9  F (37.2 C) (Oral)   Ht 5\' 10"  (1.778 m)   Wt 225 lb (102.1 kg)   BMI 32.28 kg/m  No LMP for male patient. Psych:  Alert and cooperative. Normal mood and affect. General:   Alert,  Well-developed, well-nourished, pleasant and cooperative in NAD Head:  Normocephalic and atraumatic. Eyes:  Sclera clear, no icterus.   Conjunctiva pink. Ears:  Normal auditory acuity. Nose:  No deformity, discharge, or lesions. Mouth:  No deformity or lesions,oropharynx pink & moist. Neck:  Supple; no masses or thyromegaly. Lungs:  Respirations even and unlabored.  Clear throughout to auscultation.   No wheezes, crackles, or rhonchi. No acute distress. Heart:  Regular rate and rhythm; no murmurs, clicks, rubs, or gallops. Abdomen:  Normal bowel sounds.  No bruits.   Soft, non-tender and non-distended without masses, hepatosplenomegaly or hernias noted.  No guarding or rebound tenderness.  Negative Carnett sign.   Rectal:  Deferred.  Msk:  Symmetrical without gross deformities.  Good, equal movement & strength bilaterally. Pulses:  Normal pulses noted. Extremities:  No clubbing or edema.  No cyanosis. Neurologic:  Alert and oriented x3;  grossly normal neurologically. Skin:  Intact without significant lesions or rashes.  No jaundice. Lymph Nodes:  No significant cervical adenopathy. Psych:  Alert and cooperative. Normal mood and affect.  Imaging Studies: No results found.  Assessment and Plan:   Ryan Bentley is a 82 y.o. y/o male who comes in today with what appears to be C. difficile colitis. I'm not sure if the patient had diarrhea before being diagnosed with C. difficile colitis and was treated with antibiotics that may have caused him to have C. difficile colitis or if it is diarrhea is solely caused by the C. difficile. The patient will be given an additional week of Flagyl 500 mg 3 times a day because of his long-standing diarrhea and to make sure that he eradicate's his C. difficile. The patient has also been told that if his diarrhea returns after he stops the medication he should be tested for C. difficile again and treated if positive. If the C. difficile is negative he may have underlying microscopic colitis or other cause of his diarrhea. The patient has been explained the plan and agrees with it   Lucilla Lame, MD. [facility]   Note: This dictation was prepared with Dragon dictation along with smaller phrase technology. Any transcriptional errors that result from this process are unintentional.

## 2016-08-29 NOTE — Patient Instructions (Addendum)

## 2016-09-05 ENCOUNTER — Telehealth: Payer: Self-pay

## 2016-09-05 NOTE — Telephone Encounter (Signed)
Pt called this morning saying he thought you had given him different instructions on how to take the Flagyl. You had me send in an additional 7 days to the pt's pharmacy. He was saying you had told him to take it every other day but I do not see this in your plan. Please advise.

## 2016-09-05 NOTE — Telephone Encounter (Signed)
The patient was told to take the last 3 days of medication every other day

## 2016-09-06 NOTE — Telephone Encounter (Signed)
Pt returned my call to verbalized he understood my instructions of how to take his medication per Dr. Allen Norris.

## 2016-09-17 ENCOUNTER — Telehealth: Payer: Self-pay | Admitting: Family Medicine

## 2016-09-17 NOTE — Telephone Encounter (Signed)
MEDICARE PATIENT: Called Pt to schedule AWV with [facility] & CPE with PCP for March - knb

## 2016-10-22 ENCOUNTER — Ambulatory Visit (INDEPENDENT_AMBULATORY_CARE_PROVIDER_SITE_OTHER): Payer: Medicare Other

## 2016-10-22 VITALS — BP 146/64 | HR 48 | Temp 97.7°F | Ht 70.0 in | Wt 223.6 lb

## 2016-10-22 DIAGNOSIS — Z0000 Encounter for general adult medical examination without abnormal findings: Secondary | ICD-10-CM

## 2016-10-22 NOTE — Patient Instructions (Signed)

## 2016-10-22 NOTE — Progress Notes (Signed)
Subjective:   Ryan Bentley is a 83 y.o. male who presents for Medicare Annual/Subsequent preventive examination.  Review of Systems:  N/A  Cardiac Risk Factors include: advanced age (>66men, >97 women);obesity (BMI >30kg/m2);male gender     Objective:    Vitals: BP (!) 162/67 (BP Location: Right Arm)   Pulse (!) 45   Temp 97.7 F (36.5 C) (Oral)   Ht 5\' 10"  (1.778 m)   Wt 223 lb 9.6 oz (101.4 kg)   BMI 32.08 kg/m   Body mass index is 32.08 kg/m.  Tobacco History  Smoking Status  . Former Smoker  . Types: Cigarettes  . Quit date: 09/30/1966  Smokeless Tobacco  . Never Used     Counseling given: Not Answered   Past Medical History:  Diagnosis Date  . Arthritis   . Asthma   . History of colon polyps    All benign per patient report  . History of echocardiogram    a. 03/2015: a. EF 55-60%, no WMA, nl diastolic fxn, trivial TR, nl PASP  . Macular degeneration   . Migraine headache    Past Surgical History:  Procedure Laterality Date  . APPENDECTOMY    . appendectomy and adenoidectomy  1940's  . CATARACT EXTRACTION     x2  . DENTAL SURGERY  10/2010   Family History  Problem Relation Age of Onset  . Heart disease Mother    History  Sexual Activity  . Sexual activity: No    Outpatient Encounter Prescriptions as of 10/22/2016  Medication Sig  . albuterol (PROAIR HFA) 108 (90 Base) MCG/ACT inhaler Inhale 2 puffs into the lungs every 6 (six) hours as needed for wheezing or shortness of breath.  . Fish Oil-Cholecalciferol (FISH OIL + D3 PO) Take 1 capsule by mouth 2 (two) times daily.  . Fluticasone-Salmeterol (ADVAIR) 250-50 MCG/DOSE AEPB Inhale 1 puff into the lungs 2 (two) times daily.  . Multiple Vitamins-Minerals (PRESERVISION AREDS 2) CAPS Take 2 capsules by mouth daily.  . MULTIPLE VITAMINS-MINERALS PO Take by mouth. Reported on 12/06/2015  . tamsulosin (FLOMAX) 0.4 MG CAPS capsule TAKE 1 CAPSULE EVERY DAY  . fluticasone (VERAMYST) 27.5 MCG/SPRAY  nasal spray Place 2 sprays into the nose daily as needed for rhinitis.   No facility-administered encounter medications on file as of 10/22/2016.     Activities of Daily Living In your present state of health, do you have any difficulty performing the following activities: 10/22/2016 12/06/2015  Hearing? N N  Vision? Y N  Difficulty concentrating or making decisions? Y N  Walking or climbing stairs? N Y  Dressing or bathing? N N  Doing errands, shopping? N N  Preparing Food and eating ? N -  Using the Toilet? N -  In the past six months, have you accidently leaked urine? N -  Do you have problems with loss of bowel control? Y -  Managing your Medications? N -  Managing your Finances? N -  Housekeeping or managing your Housekeeping? N -  Some recent data might be hidden    Patient Care Team: Birdie Sons, MD as PCP - General (Family Medicine) Estill Cotta, MD as Consulting Physician (Ophthalmology) Lucilla Lame, MD as Consulting Physician (Gastroenterology)   Assessment:     Exercise Activities and Dietary recommendations Current Exercise Habits: Structured exercise class, Type of exercise: Other - see comments;stretching;strength training/weights (on stationary bike), Time (Minutes): 25, Frequency (Times/Week): 6, Weekly Exercise (Minutes/Week): 150, Intensity: Mild, Exercise limited by: orthopedic  condition(s)  Goals    . Increase water intake          Starting 10/22/16, I will continue to drink 6-8 glasses of water a day.      Fall Risk Fall Risk  10/22/2016 12/06/2015  Falls in the past year? No No   Depression Screen PHQ 2/9 Scores 10/22/2016 12/06/2015  PHQ - 2 Score 1 0    Cognitive Function     6CIT Screen 10/22/2016  What Year? 0 points  What month? 0 points  What time? 0 points  Count back from 20 0 points  Months in reverse 0 points  Repeat phrase 0 points  Total Score 0    Immunization History  Administered Date(s) Administered  .  Influenza-Unspecified 08/01/2015  . Pneumococcal Conjugate-13 11/30/2014  . Pneumococcal Polysaccharide-23 08/22/2008  . Zoster 12/06/2015   Screening Tests Health Maintenance  Topic Date Due  . TETANUS/TDAP  04/27/2023  . INFLUENZA VACCINE  Completed  . ZOSTAVAX  Completed  . PNA vac Low Risk Adult  Completed      Plan:  I have personally reviewed and addressed the Medicare Annual Wellness questionnaire and have noted the following in the patient's chart:  A. Medical and social history B. Use of alcohol, tobacco or illicit drugs  C. Current medications and supplements D. Functional ability and status E.  Nutritional status F.  Physical activity G. Advance directives H. List of other physicians I.  Hospitalizations, surgeries, and ER visits in previous 12 months J.  [place] such as hearing and vision if needed, cognitive and depression L. Referrals and appointments - none  In addition, I have reviewed and discussed with patient certain preventive protocols, quality metrics, and best practice recommendations. A written personalized care plan for preventive services as well as general preventive health recommendations were provided to patient.  See attached scanned questionnaire for additional information.   Signed,  Fabio Neighbors, LPN Nurse Health Advisor   MD Recommendations: None  I have reviewed the health advisor's note, was available for consultation, and agree with documentation and plan  Lelon Huh, MD

## 2016-10-24 ENCOUNTER — Other Ambulatory Visit: Payer: Self-pay | Admitting: Family Medicine

## 2016-10-24 NOTE — Telephone Encounter (Signed)
Received note from pharmacy regarding patient's Advair. I think patient had only been taking it intermittently, but it is usually taken continuously. If patient is having any wheezing or any trouble feeling short of breath then would suggest staying on Advair and can send in refill for 1 disk with 12 refills to [facility].

## 2016-10-25 NOTE — Telephone Encounter (Signed)
Spoke with patient. Patient states he uses Advair continuously but he uses [facility]

## 2016-11-12 ENCOUNTER — Encounter: Payer: Self-pay | Admitting: Family Medicine

## 2016-11-12 ENCOUNTER — Ambulatory Visit (INDEPENDENT_AMBULATORY_CARE_PROVIDER_SITE_OTHER): Payer: Medicare Other | Admitting: Family Medicine

## 2016-11-12 VITALS — BP 160/64 | HR 49 | Temp 97.7°F | Resp 16 | Ht 70.0 in | Wt 227.0 lb

## 2016-11-12 DIAGNOSIS — N400 Enlarged prostate without lower urinary tract symptoms: Secondary | ICD-10-CM

## 2016-11-12 DIAGNOSIS — R490 Dysphonia: Secondary | ICD-10-CM | POA: Diagnosis not present

## 2016-11-12 DIAGNOSIS — I10 Essential (primary) hypertension: Secondary | ICD-10-CM | POA: Diagnosis not present

## 2016-11-12 DIAGNOSIS — L219 Seborrheic dermatitis, unspecified: Secondary | ICD-10-CM

## 2016-11-12 DIAGNOSIS — J45909 Unspecified asthma, uncomplicated: Secondary | ICD-10-CM

## 2016-11-12 DIAGNOSIS — K219 Gastro-esophageal reflux disease without esophagitis: Secondary | ICD-10-CM

## 2016-11-12 MED ORDER — AMLODIPINE BESYLATE 5 MG PO TABS: 5.0000 mg | ORAL_TABLET | Freq: Every day | ORAL | 3 refills | Status: DC

## 2016-11-12 MED ORDER — CLOTRIMAZOLE-BETAMETHASONE 1-0.05 % EX CREA: 1.0000 "application " | TOPICAL_CREAM | Freq: Two times a day (BID) | CUTANEOUS | 2 refills | Status: DC

## 2016-11-12 NOTE — Progress Notes (Deleted)
Patient: Ryan Bentley, Male    DOB: 1932/08/10, 82 y.o.   MRN: [id number] Visit Date: 11/12/2016  Today's Provider: Lelon Huh, MD   Chief Complaint  Patient presents with  . Medicare Wellness  . Hyperlipidemia  . COPD  . Asthma  . Hypertension   Subjective:    Annual wellness visit Ryan Bentley is a 83 y.o. male. He feels fairly well. He reports exercising yes/walking. He reports he is sleeping fairly well.  -----------------------------------------------------------  Asthma, mild intermittent, uncomplicated From 123456 controlled current inhalers.  Elevated blood pressure From 12/06/2015-no changes.  Hyperglycemia From 12/06/2015-no changes.  COPD exacerbation ([place]) From 05/30/2016-prescribed prednisone and doxycycline due to exacerbation.   Review of Systems  Musculoskeletal: Positive for back pain and gait problem.  All other systems reviewed and are negative.   Social History   Social History  . Marital status: Widowed    Spouse name: N/A  . Number of children: N/A  . Years of education: N/A   Occupational History  . Not on file.   Social History Main Topics  . Smoking status: Former Smoker    Types: Cigarettes    Quit date: 09/30/1966  . Smokeless tobacco: Never Used  . Alcohol use No  . Drug use: No  . Sexual activity: No   Other Topics Concern  . Not on file   Social History Narrative  . No narrative on file    Past Medical History:  Diagnosis Date  . Arthritis   . Asthma   . History of colon polyps    All benign per patient report  . History of echocardiogram    a. 03/2015: a. EF 55-60%, no WMA, nl diastolic fxn, trivial TR, nl PASP  . Macular degeneration   . Migraine headache      Patient Active Problem List   Diagnosis Date Noted  . C. difficile colitis 08/23/2016  . Actinic keratosis 03/29/2015  . Liver cyst 03/29/2015  . Thyroid nodule 03/29/2015  . BPH (benign prostatic hyperplasia) 03/28/2015  .  COPD (chronic obstructive pulmonary disease) ([place]) 03/28/2015  . History of colonic polyps 03/28/2015  . Migraine 03/28/2015  . BMI 34.0-34.9,adult 03/28/2015  . Elevated blood pressure 03/24/2015  . Asthma 03/24/2015  . Obesity 03/23/2015  . Dyspnea on exertion 03/23/2015  . Seborrhea 06/15/2007  . 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 GERD (gastroesophageal reflux disease) 09/30/1998    Past Surgical History:  Procedure Laterality Date  . APPENDECTOMY    . appendectomy and adenoidectomy  1940's  . CATARACT EXTRACTION     x2  . DENTAL SURGERY  10/2010    His family history includes Heart disease in his mother.      Current Outpatient Prescriptions:  .  albuterol (PROAIR HFA) 108 (90 Base) MCG/ACT inhaler, Inhale 2 puffs into the lungs every 6 (six) hours as needed for wheezing or shortness of breath., Disp: 1 Inhaler, Rfl: 3 .  Fish Oil-Cholecalciferol (FISH OIL + D3 PO), Take 1 capsule by mouth 2 (two) times daily., Disp: , Rfl:  .  Fluticasone-Salmeterol (ADVAIR) 250-50 MCG/DOSE AEPB, Inhale 1 puff into the lungs 2 (two) times daily., Disp: 3 each, Rfl: 4 .  Multiple Vitamins-Minerals (PRESERVISION AREDS 2) CAPS, Take 2 capsules by mouth daily., Disp: , Rfl:  .  MULTIPLE VITAMINS-MINERALS PO, Take by mouth. Reported on 12/06/2015, Disp: , Rfl:  .  tamsulosin (FLOMAX) 0.4 MG CAPS capsule, TAKE 1 CAPSULE EVERY DAY,  Disp: 30 capsule, Rfl: 12 .  fluticasone (VERAMYST) 27.5 MCG/SPRAY nasal spray, Place 2 sprays into the nose daily as needed for rhinitis., Disp: , Rfl:   Patient Care Team: Birdie Sons, MD as PCP - General (Family Medicine) Estill Cotta, MD as Consulting Physician (Ophthalmology) Lucilla Lame, MD as Consulting Physician (Gastroenterology)     Objective:   Vitals: BP (!) 160/60 (BP Location: Right Arm, Patient Position: Sitting, Cuff Size: Large)   Pulse (!) 49   Temp 97.7 F (36.5 C) (Oral)   Resp 16   Ht 5\' 10"  (1.778 m)   Wt  227 lb (103 kg)   SpO2 97%   BMI 32.57 kg/m   Physical Exam  Activities of Daily Living In your present state of health, do you have any difficulty performing the following activities: 10/22/2016 12/06/2015  Hearing? N N  Vision? Y N  Difficulty concentrating or making decisions? Y N  Walking or climbing stairs? N Y  Dressing or bathing? N N  Doing errands, shopping? N N  Preparing Food and eating ? N -  Using the Toilet? N -  In the past six months, have you accidently leaked urine? N -  Do you have problems with loss of bowel control? Y -  Managing your Medications? N -  Managing your Finances? N -  Housekeeping or managing your Housekeeping? N -  Some recent data might be hidden    Fall Risk Assessment Fall Risk  10/22/2016 12/06/2015  Falls in the past year? No No     Depression Screen PHQ 2/9 Scores 10/22/2016 12/06/2015  PHQ - 2 Score 1 0      Assessment & Plan:     Annual Wellness Visit  Reviewed patient's Family Medical History Reviewed and updated list of patient's medical providers Assessment of cognitive impairment was done Assessed patient's functional ability Established a written schedule for health screening [place] Completed and Reviewed  Exercise Activities and Dietary recommendations Goals    . Increase water intake          Starting 10/22/16, I will continue to drink 6-8 glasses of water a day.       Immunization History  Administered Date(s) Administered  . Influenza-Unspecified 08/01/2015, 07/26/2016  . Pneumococcal Conjugate-13 11/30/2014  . Pneumococcal Polysaccharide-23 08/22/2008  . Zoster 12/06/2015    Health Maintenance  Topic Date Due  . Samul Dada  04/27/2023  . INFLUENZA VACCINE  Completed  . ZOSTAVAX  Completed  . PNA vac Low Risk Adult  Completed     Discussed health benefits of physical activity, and encouraged him to engage in regular exercise appropriate for his age and condition.      --------------------------------------------------------------------------    Lelon Huh, MD  [place]

## 2016-11-12 NOTE — Progress Notes (Signed)
Patient: Ryan Bentley Male    DOB: 07-Oct-1931   83 y.o.   MRN: [id number] Visit Date: 11/12/2016  Today's Provider: Lelon Huh, MD   Chief Complaint  Patient presents with  . Hyperlipidemia  . COPD  . Asthma  . Hypertension   Subjective:    HPI   Asthma, mild intermittent, uncomplicated From 123456 controlled current inhalers.  Elevated blood pressure From 12/06/2015-no changes.  Hyperglycemia From 12/06/2015-no changes.  COPD exacerbation ([place]) From 05/30/2016-prescribed prednisone and doxycycline due to exacerbation.   Allergies  Allergen Reactions  . Iodine Itching  . Ivp Dye [Iodinated Diagnostic Agents] Anxiety    Agitation     Current Outpatient Prescriptions:  .  albuterol (PROAIR HFA) 108 (90 Base) MCG/ACT inhaler, Inhale 2 puffs into the lungs every 6 (six) hours as needed for wheezing or shortness of breath., Disp: 1 Inhaler, Rfl: 3 .  Fish Oil-Cholecalciferol (FISH OIL + D3 PO), Take 1 capsule by mouth 2 (two) times daily., Disp: , Rfl:  .  Fluticasone-Salmeterol (ADVAIR) 250-50 MCG/DOSE AEPB, Inhale 1 puff into the lungs 2 (two) times daily., Disp: 3 each, Rfl: 4 .  Multiple Vitamins-Minerals (PRESERVISION AREDS 2) CAPS, Take 2 capsules by mouth daily., Disp: , Rfl:  .  MULTIPLE VITAMINS-MINERALS PO, Take by mouth. Reported on 12/06/2015, Disp: , Rfl:  .  tamsulosin (FLOMAX) 0.4 MG CAPS capsule, TAKE 1 CAPSULE EVERY DAY, Disp: 30 capsule, Rfl: 12 .  fluticasone (VERAMYST) 27.5 MCG/SPRAY nasal spray, Place 2 sprays into the nose daily as needed for rhinitis., Disp: , Rfl:   Review of Systems  Constitutional: Negative for appetite change, chills and fever.  Respiratory: Negative for chest tightness, shortness of breath and wheezing.   Cardiovascular: Negative for chest pain and palpitations.  Gastrointestinal: Negative for abdominal pain, nausea and vomiting.  Musculoskeletal: Positive for arthralgias and gait problem.    Social  History  Substance Use Topics  . Smoking status: Former Smoker    Types: Cigarettes    Quit date: 09/30/1966  . Smokeless tobacco: Never Used  . Alcohol use No   Objective:     Vitals:   11/12/16 1410 11/12/16 1419 11/12/16 1433  BP: (!) 160/60 (!) 130/48 (!) 160/64  Pulse: (!) 49    Resp: 16    Temp: 97.7 F (36.5 C)    TempSrc: Oral    SpO2: 97%    Weight: 227 lb (103 kg)    Height: 5\' 10"  (1.778 m)       Physical Exam  General Appearance:    Alert, cooperative, no distress  HENT:   ENT exam normal, no neck nodes or sinus tenderness  Eyes:    PERRL, conjunctiva/corneas clear, EOM's intact       Lungs:     Clear to auscultation bilaterally, respirations unlabored  Heart:    Regular rate and rhythm  Neurologic:   Awake, alert, oriented x 3. No apparent focal neurological           defect.           Assessment & Plan:     1. 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   2. Gastroesophageal reflux disease, esophagitis presence not specified Patient Instructions  Try OTC reflux medication such Zantac to see if it helps hoarseness   3. Benign prostatic hyperplasia without lower urinary tract symptoms Currently asymptomatic.He is going to try d/c tamsulosin  4. Seborrhea (ears_ Did well with lotrisone in  the past and requests refill.  - clotrimazole-betamethasone (LOTRISONE) cream; Apply 1 application topically 2 (two) times daily.  Dispense: 15 g; Refill: 2  5. Essential hypertension Start amlodipine 5mg . Call if any adverse effects.   6. Hoarseness Likely secondary to reflux. He declined ENT referral, but will reconsider if OTC reflux meds not helping.    Had flu shot at [facility].       Lelon Huh, MD  [facility]

## 2016-11-12 NOTE — Patient Instructions (Signed)
Try OTC reflux medication such Zantac to see if it helps hoarseness

## 2017-01-06 ENCOUNTER — Ambulatory Visit (INDEPENDENT_AMBULATORY_CARE_PROVIDER_SITE_OTHER): Payer: Medicare Other | Admitting: Family Medicine

## 2017-01-06 ENCOUNTER — Encounter: Payer: Self-pay | Admitting: Family Medicine

## 2017-01-06 VITALS — BP 132/64 | HR 71 | Temp 97.8°F | Resp 16 | Wt 221.0 lb

## 2017-01-06 DIAGNOSIS — I10 Essential (primary) hypertension: Secondary | ICD-10-CM | POA: Diagnosis not present

## 2017-01-06 DIAGNOSIS — R490 Dysphonia: Secondary | ICD-10-CM | POA: Diagnosis not present

## 2017-01-06 DIAGNOSIS — R739 Hyperglycemia, unspecified: Secondary | ICD-10-CM | POA: Insufficient documentation

## 2017-01-06 MED ORDER — AMLODIPINE BESYLATE 5 MG PO TABS: 5.0000 mg | ORAL_TABLET | Freq: Every day | ORAL | 4 refills | Status: DC

## 2017-01-06 NOTE — Progress Notes (Signed)
Patient: Ryan Bentley Male    DOB: 02/21/1932   83 y.o.   MRN: [id number] Visit Date: 01/06/2017  Today's Provider: Lelon Huh, MD   Chief Complaint  Patient presents with  . Hypertension    follow up  . Gastroesophageal Reflux    follow up   Subjective:    HPI  Hypertension, follow-up:  BP Readings from Last 3 Encounters:  11/12/16 (!) 160/64  10/22/16 (!) 146/64  08/29/16 (!) 150/75    He was last seen for hypertension 7 weeks ago.  BP at that visit was 160/64. Management since that visit includes starting Amlodipine 5mg  daily. He reports good compliance with treatment. He is not having side effects.  He is exercising. He is not adherent to low salt diet.   Outside blood pressures are not being checked. He is experiencing none.  Patient denies chest pain, chest pressure/discomfort, claudication, dyspnea, exertional chest pressure/discomfort, fatigue, irregular heart beat, lower extremity edema, near-syncope, orthopnea, palpitations, paroxysmal nocturnal dyspnea, syncope and tachypnea.   Cardiovascular risk factors include advanced age (older than 55 for men, 47 for women) and hypertension.  Use of agents associated with hypertension: none.     Weight trend: decreasing steadily Wt Readings from Last 3 Encounters:  11/12/16 227 lb (103 kg)  10/22/16 223 lb 9.6 oz (101.4 kg)  08/29/16 225 lb (102.1 kg)    Current diet: well balanced  ------------------------------------------------------------------------ Follow up of GERD and hoarseness:  Patient was last seen for this problem 7 weeks ago. Changes made during that visit includes starting OTC Zantac. Patient declined referral to ENT during that visit. Patient reports good compliance  With treatment, good tolerance and fair symptom control. Patient states that the hoarsnesss has improved, but the increase in pollen has caused some PND and chest congestion.     Allergies  Allergen Reactions  . Iodine  Itching  . Ivp Dye [Iodinated Diagnostic Agents] Anxiety    Agitation     Current Outpatient Prescriptions:  .  albuterol (PROAIR HFA) 108 (90 Base) MCG/ACT inhaler, Inhale 2 puffs into the lungs every 6 (six) hours as needed for wheezing or shortness of breath., Disp: 1 Inhaler, Rfl: 3 .  amLODipine (NORVASC) 5 MG tablet, Take 1 tablet (5 mg total) by mouth daily., Disp: 30 tablet, Rfl: 3 .  clotrimazole-betamethasone (LOTRISONE) cream, Apply 1 application topically 2 (two) times daily., Disp: 15 g, Rfl: 2 .  Fish Oil-Cholecalciferol (FISH OIL + D3 PO), Take 1 capsule by mouth 2 (two) times daily., Disp: , Rfl:  .  Fluticasone-Salmeterol (ADVAIR) 250-50 MCG/DOSE AEPB, Inhale 1 puff into the lungs 2 (two) times daily., Disp: 3 each, Rfl: 4 .  Multiple Vitamins-Minerals (PRESERVISION AREDS 2) CAPS, Take 2 capsules by mouth daily., Disp: , Rfl:  .  MULTIPLE VITAMINS-MINERALS PO, Take by mouth. Reported on 12/06/2015, Disp: , Rfl:  .  tamsulosin (FLOMAX) 0.4 MG CAPS capsule, TAKE 1 CAPSULE EVERY DAY (Patient not taking: Reported on 01/06/2017), Disp: 30 capsule, Rfl: 12  Review of Systems  Constitutional: Negative for appetite change, chills and fever.  Respiratory: Negative for chest tightness, shortness of breath and wheezing.   Cardiovascular: Negative for chest pain and palpitations.  Gastrointestinal: Negative for abdominal pain, nausea and vomiting.    Social History  Substance Use Topics  . Smoking status: Former Smoker    Types: Cigarettes    Quit date: 09/30/1966  . Smokeless tobacco: Never Used  . Alcohol use 0.0 oz/week  Comment: occasional use   Objective:   BP 132/64 (BP Location: Left Arm, Patient Position: Sitting, Cuff Size: Large)   Pulse 71   Temp 97.8 F (36.6 C) (Oral)   Resp 16   Wt 221 lb (100.2 kg)   SpO2 98% Comment: room air  BMI 31.71 kg/m  There were no vitals filed for this visit.   Physical Exam   General Appearance:    Alert, cooperative, no  distress  Eyes:    PERRL, conjunctiva/corneas clear, EOM's intact       Lungs:     Clear to auscultation bilaterally, respirations unlabored  Heart:    Regular rate and rhythm  Neurologic:   Awake, alert, oriented x 3. No apparent focal neurological           defect.           Assessment & Plan:     1. Essential hypertension Well controlled.  Continue current medications.   - amLODipine (NORVASC) 5 MG tablet; Take 1 tablet (5 mg total) by mouth daily.  Dispense: 90 tablet; Refill: 4 - Renal function panel  2. Hoarseness Improved since starting H2 blocker for GERD. ccontinue same dose.  - ranitidine (ZANTAC) 150 MG tablet; Take 150 mg by mouth daily.  3. Hyperglycemia  - Hemoglobin A1c  Return in about 6 months (around 07/08/2017).        Lelon Huh, MD  [facility]

## 2017-01-07 LAB — RENAL FUNCTION PANEL
Albumin: 4.1 g/dL (ref 3.5–4.7)
BUN/Creatinine Ratio: 14 (ref 10–24)
BUN: 15 mg/dL (ref 8–27)
CO2: 24 mmol/L (ref 18–29)
Calcium: 10.1 mg/dL (ref 8.6–10.2)
Chloride: 105 mmol/L (ref 96–106)
Creatinine, Ser: 1.08 mg/dL (ref 0.76–1.27)
GFR calc Af Amer: 73 mL/min/{1.73_m2} (ref >59)
GFR, EST NON AFRICAN AMERICAN: 63 mL/min/{1.73_m2} (ref >59)
GLUCOSE: 76 mg/dL (ref 65–99)
PHOSPHORUS: 3.5 mg/dL (ref 2.5–4.5)
POTASSIUM: 4.8 mmol/L (ref 3.5–5.2)
SODIUM: 143 mmol/L (ref 134–144)

## 2017-01-07 LAB — HEMOGLOBIN A1C
ESTIMATED AVERAGE GLUCOSE: 108 mg/dL
Hgb A1c MFr Bld: 5.4 % (ref 4.8–5.6)

## 2017-01-07 NOTE — Progress Notes (Signed)
Advised  ED 

## 2017-04-24 DIAGNOSIS — H353131 Nonexudative age-related macular degeneration, bilateral, early dry stage: Secondary | ICD-10-CM | POA: Diagnosis not present

## 2017-05-08 DIAGNOSIS — H353221 Exudative age-related macular degeneration, left eye, with active choroidal neovascularization: Secondary | ICD-10-CM | POA: Diagnosis not present

## 2017-05-09 DIAGNOSIS — H353222 Exudative age-related macular degeneration, left eye, with inactive choroidal neovascularization: Secondary | ICD-10-CM | POA: Diagnosis not present

## 2017-05-09 DIAGNOSIS — H353131 Nonexudative age-related macular degeneration, bilateral, early dry stage: Secondary | ICD-10-CM | POA: Diagnosis not present

## 2017-06-10 ENCOUNTER — Other Ambulatory Visit: Payer: Self-pay | Admitting: *Deleted

## 2017-06-10 MED ORDER — FLUTICASONE-SALMETEROL 250-50 MCG/DOSE IN AEPB: 1.0000 | INHALATION_SPRAY | Freq: Two times a day (BID) | RESPIRATORY_TRACT | 4 refills | Status: DC

## 2017-06-13 DIAGNOSIS — H353222 Exudative age-related macular degeneration, left eye, with inactive choroidal neovascularization: Secondary | ICD-10-CM | POA: Diagnosis not present

## 2017-06-14 IMAGING — CR DG CHEST 1V
1 series · 2 of 2 positions shown · non-contrast
Comparison: Chest radiograph performed earlier today at [DATE] a.m.

CLINICAL DATA: Acute onset of shortness of breath and wheezing.
Initial encounter.

EXAM:
CHEST  1 VIEW

[Series 1: portable · 0.17mm/px · 2 of 2 slices shown]
[im 1/2]
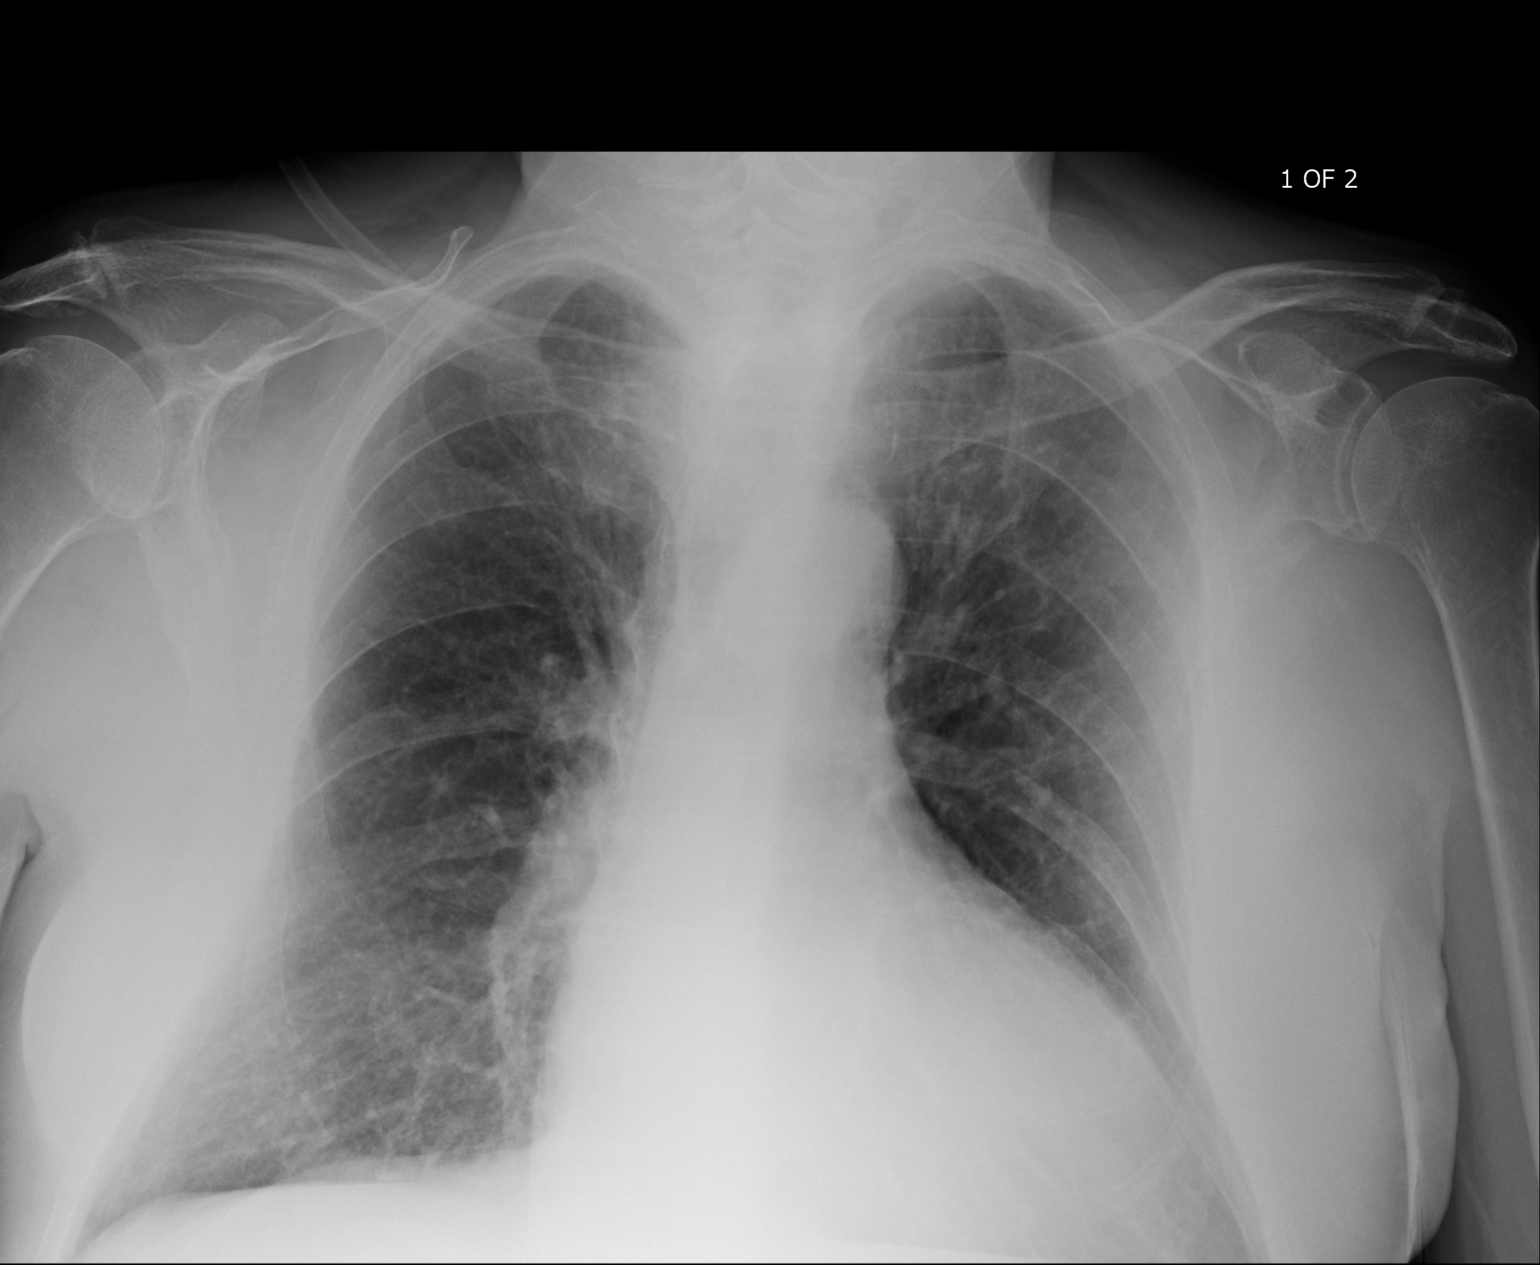
[im 2/2]
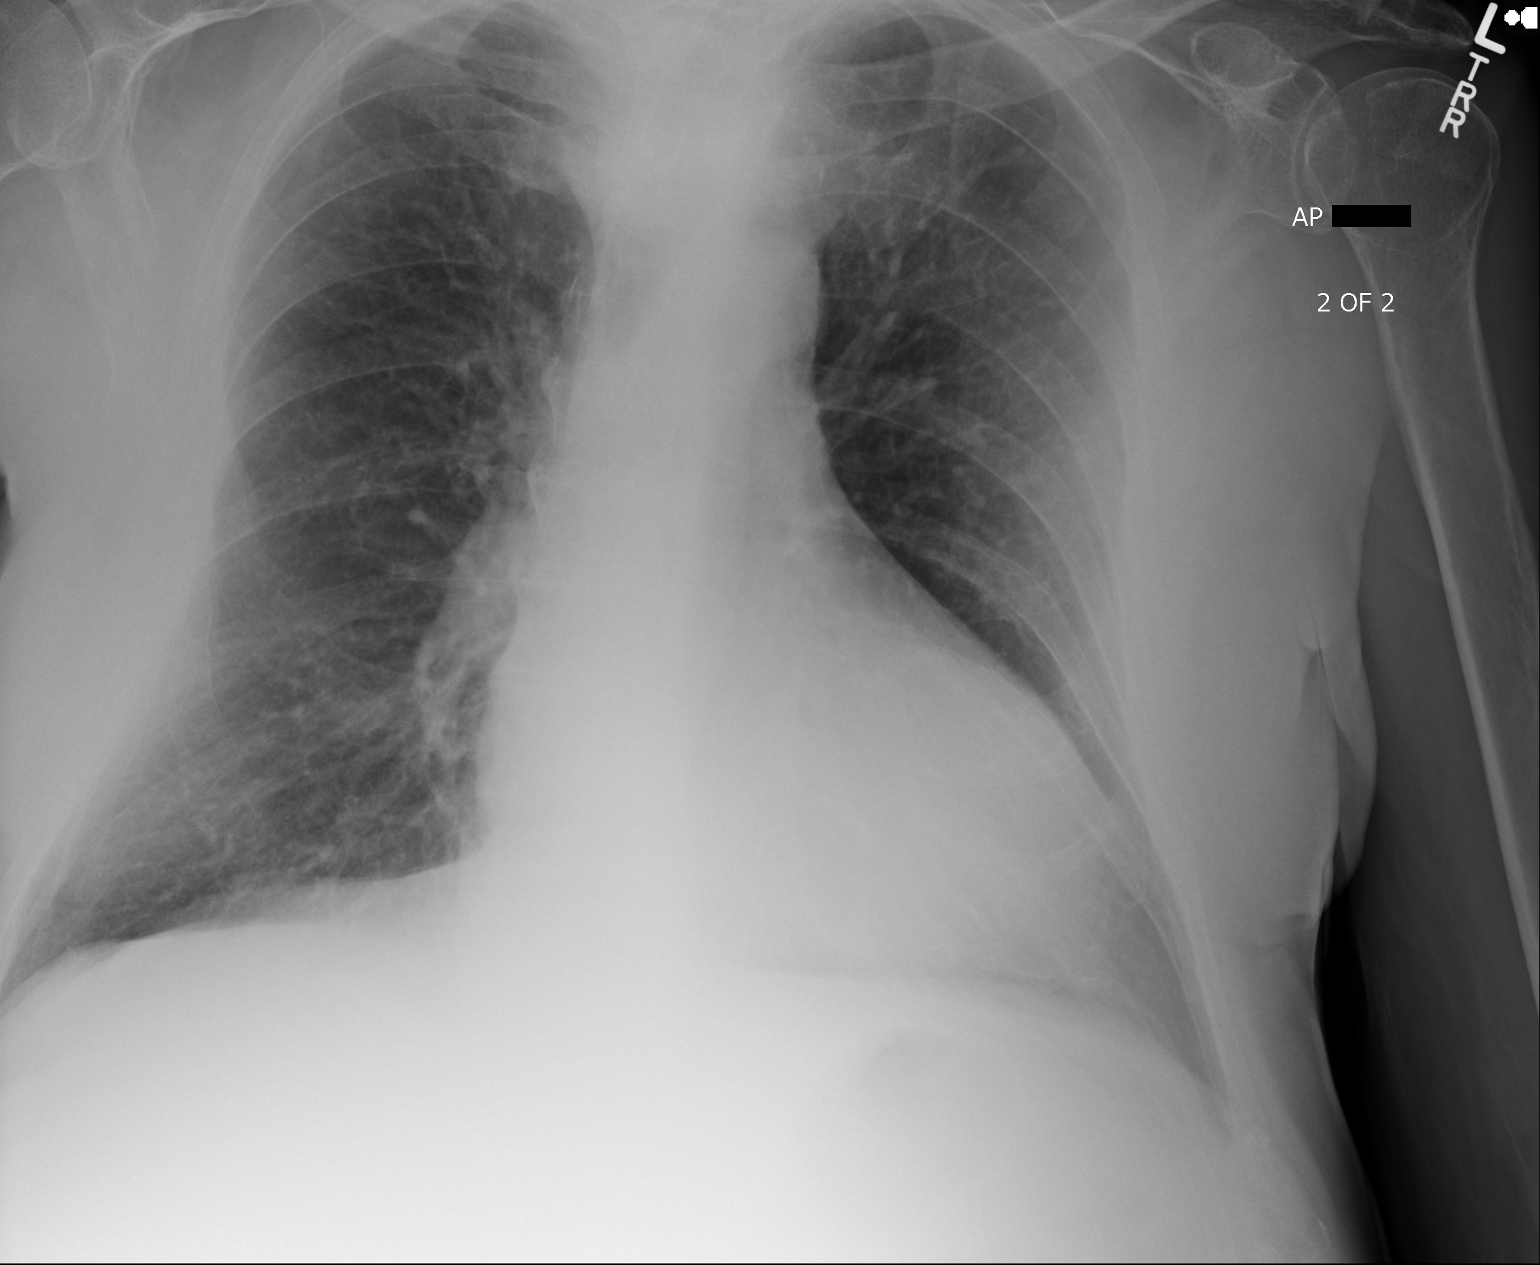

[2 of 2 positions shown; findings below may reference images not displayed]

FINDINGS: The lungs are well-aerated. Mild vascular congestion is noted. There
is no evidence of focal opacification, pleural effusion or
pneumothorax.

The cardiomediastinal silhouette is borderline normal in size. No
acute osseous abnormalities are seen.
IMPRESSION: Mild vascular congestion noted.  Lungs remain grossly clear.

## 2017-07-08 ENCOUNTER — Encounter: Payer: Self-pay | Admitting: Family Medicine

## 2017-07-08 ENCOUNTER — Other Ambulatory Visit: Payer: Self-pay | Admitting: Family Medicine

## 2017-07-08 ENCOUNTER — Ambulatory Visit (INDEPENDENT_AMBULATORY_CARE_PROVIDER_SITE_OTHER): Payer: Medicare Other | Admitting: Family Medicine

## 2017-07-08 VITALS — BP 110/70 | HR 61 | Temp 97.7°F | Resp 16 | Ht 70.0 in | Wt 235.0 lb

## 2017-07-08 DIAGNOSIS — J449 Chronic obstructive pulmonary disease, unspecified: Secondary | ICD-10-CM | POA: Diagnosis not present

## 2017-07-08 DIAGNOSIS — M545 Low back pain, unspecified: Secondary | ICD-10-CM

## 2017-07-08 DIAGNOSIS — I10 Essential (primary) hypertension: Secondary | ICD-10-CM

## 2017-07-08 DIAGNOSIS — L219 Seborrheic dermatitis, unspecified: Secondary | ICD-10-CM | POA: Diagnosis not present

## 2017-07-08 DIAGNOSIS — H3530 Unspecified macular degeneration: Secondary | ICD-10-CM | POA: Insufficient documentation

## 2017-07-08 MED ORDER — FLUTICASONE-SALMETEROL 250-50 MCG/DOSE IN AEPB: 1.0000 | INHALATION_SPRAY | Freq: Two times a day (BID) | RESPIRATORY_TRACT | 2 refills | Status: DC

## 2017-07-08 MED ORDER — FLUTICASONE-SALMETEROL 250-50 MCG/DOSE IN AEPB: 2.0000 | INHALATION_SPRAY | Freq: Two times a day (BID) | RESPIRATORY_TRACT | 2 refills | Status: DC

## 2017-07-08 MED ORDER — CLOTRIMAZOLE-BETAMETHASONE 1-0.05 % EX CREA: 1.0000 "application " | TOPICAL_CREAM | Freq: Two times a day (BID) | CUTANEOUS | 3 refills | Status: DC

## 2017-07-08 NOTE — Progress Notes (Signed)
Patient: Ryan Bentley Male    DOB: Oct 25, 1931   83 y.o.   MRN: [id number] Visit Date: 07/08/2017  Today's Provider: Lelon Huh, MD   Chief Complaint  Patient presents with  . Follow-up   Subjective:    HPI   Hypertension, follow-up:  BP Readings from Last 3 Encounters:  07/08/17 110/70  01/06/17 132/64  11/12/16 (!) 160/64    He was last seen for hypertension 6 months ago.  BP at that visit was 132/64. Management since that visit includes; no changes.He reports good compliance with treatment. He is not having side effects. none He is exercising. He is not adherent to low salt diet.   Outside blood pressures are not checking. He is experiencing none.  Patient denies none.   Cardiovascular risk factors include hypertension.  Use of agents associated with hypertension: none.   ----------------------------------------------------------------  Wt Readings from Last 3 Encounters:  07/08/17 235 lb (106.6 kg)  01/06/17 221 lb (100.2 kg)  11/12/16 227 lb (103 kg)    Hoarseness From 01/06/2017-Improved since starting H2 blocker for GERD. continued same dose.   His main complaint today is persistent low back pain without radiation. He has worked some with PT and has been doing exercises which help, but it does limit physical activity to some degree.     Allergies  Allergen Reactions  . Iodine Itching  . Ivp Dye [Iodinated Diagnostic Agents] Anxiety    Agitation     Current Outpatient Prescriptions:  .  albuterol (PROAIR HFA) 108 (90 Base) MCG/ACT inhaler, Inhale 2 puffs into the lungs every 6 (six) hours as needed for wheezing or shortness of breath., Disp: 1 Inhaler, Rfl: 3 .  amLODipine (NORVASC) 5 MG tablet, Take 1 tablet (5 mg total) by mouth daily., Disp: 90 tablet, Rfl: 4 .  clotrimazole-betamethasone (LOTRISONE) cream, Apply 1 application topically 2 (two) times daily., Disp: 15 g, Rfl: 2 .  Fish Oil-Cholecalciferol (FISH OIL + D3 PO), Take  1 capsule by mouth 2 (two) times daily., Disp: , Rfl:  .  Multiple Vitamins-Minerals (PRESERVISION AREDS 2) CAPS, Take 2 capsules by mouth daily., Disp: , Rfl:  .  MULTIPLE VITAMINS-MINERALS PO, Take by mouth. Reported on 12/06/2015, Disp: , Rfl:  .  ranitidine (ZANTAC) 150 MG tablet, Take 150 mg by mouth daily., Disp: , Rfl:  .  Fluticasone-Salmeterol (ADVAIR) 250-50 MCG/DOSE AEPB, Inhale 1 puff into the lungs 2 (two) times daily. (Patient not taking: Reported on 07/08/2017), Disp: 3 each, Rfl: 4  Review of Systems  Constitutional: Negative for appetite change, chills and fever.  Respiratory: Negative for chest tightness, shortness of breath and wheezing.   Cardiovascular: Negative for chest pain and palpitations.  Gastrointestinal: Negative for abdominal pain, nausea and vomiting.    Social History  Substance Use Topics  . Smoking status: Former Smoker    Types: Cigarettes    Quit date: 09/30/1966  . Smokeless tobacco: Never Used  . Alcohol use 0.0 oz/week     Comment: occasional use   Objective:   BP 110/70 (BP Location: Right Arm, Patient Position: Sitting, Cuff Size: Large)   Pulse 61   Temp 97.7 F (36.5 C) (Oral)   Resp 16   Ht 5\' 10"  (1.778 m)   Wt 235 lb (106.6 kg)   SpO2 97%   BMI 33.72 kg/m  Vitals:   07/08/17 0820  BP: 110/70  Pulse: 61  Resp: 16  Temp: 97.7 F (36.5 C)  TempSrc: Oral  SpO2: 97%  Weight: 235 lb (106.6 kg)  Height: 5\' 10"  (1.778 m)     Physical Exam  General Appearance:    Alert, cooperative, no distress, obese  Eyes:    PERRL, conjunctiva/corneas clear, EOM's intact       Lungs:     Clear to auscultation bilaterally, respirations unlabored  Heart:    Regular rate and rhythm  Neurologic:   Awake, alert, oriented x 3. No apparent focal neurological           defect.   MS:   Tender along bilateral para lumbar muscles.        Assessment & Plan:     1. Essential hypertension Well controlled. Continue current medications.    2.  Seborrhea refill - clotrimazole-betamethasone (LOTRISONE) cream; Apply 1 application topically 2 (two) times daily.  Dispense: 15 g; Refill: 3  3. Bilateral low back pain without sciatica, unspecified chronicity Encourage increase exercise. He is going to try back brace.   4. Chronic obstructive pulmonary disease, unspecified COPD type ([place]) Currently off of Advair, but will send refill as symptoms tend to flare in winter months.  - Fluticasone-Salmeterol (ADVAIR) 250-50 MCG/DOSE AEPB; Inhale 2 puffs into the lungs 2 (two) times daily.  Dispense: 3 each; Refill: 2  He anticipates getting flu shot at [facility].        Lelon Huh, MD  [facility]

## 2017-07-15 DIAGNOSIS — H353221 Exudative age-related macular degeneration, left eye, with active choroidal neovascularization: Secondary | ICD-10-CM | POA: Diagnosis not present

## 2017-07-15 DIAGNOSIS — H43813 Vitreous degeneration, bilateral: Secondary | ICD-10-CM | POA: Diagnosis not present

## 2017-08-19 DIAGNOSIS — H353221 Exudative age-related macular degeneration, left eye, with active choroidal neovascularization: Secondary | ICD-10-CM | POA: Diagnosis not present

## 2017-09-29 DIAGNOSIS — H353221 Exudative age-related macular degeneration, left eye, with active choroidal neovascularization: Secondary | ICD-10-CM | POA: Diagnosis not present

## 2017-10-02 ENCOUNTER — Telehealth: Payer: Self-pay

## 2017-10-02 NOTE — Telephone Encounter (Signed)
LMTCB and schedule AWV with [facility] if interested.  -MM

## 2017-10-30 DIAGNOSIS — Z961 Presence of intraocular lens: Secondary | ICD-10-CM | POA: Diagnosis not present

## 2017-11-04 DIAGNOSIS — H353221 Exudative age-related macular degeneration, left eye, with active choroidal neovascularization: Secondary | ICD-10-CM | POA: Diagnosis not present

## 2017-11-13 ENCOUNTER — Ambulatory Visit (INDEPENDENT_AMBULATORY_CARE_PROVIDER_SITE_OTHER): Payer: Medicare Other

## 2017-11-13 VITALS — BP 156/72 | HR 64 | Temp 97.7°F | Ht 70.0 in | Wt 241.0 lb

## 2017-11-13 DIAGNOSIS — Z0000 Encounter for general adult medical examination without abnormal findings: Secondary | ICD-10-CM

## 2017-11-13 NOTE — Patient Instructions (Signed)
[name] , Thank you for taking time to come for your Medicare Wellness Visit. I appreciate your ongoing commitment to your health goals. Please review the following plan we discussed and let me know if I can assist you in the future.   Screening recommendations/referrals: Colonoscopy: Up to date Recommended yearly ophthalmology/optometry visit for glaucoma screening and checkup Recommended yearly dental visit for hygiene and checkup  Vaccinations: Influenza vaccine: Up to date Pneumococcal vaccine: Up to date Tdap vaccine: Up to date Shingles vaccine: Pt declines today.     Advanced directives: Please bring a copy of your POA (Power of Attorney) and/or Living Will to your next appointment.   Conditions/risks identified: Obesity- recommend increasing water intake to 4-6 glasses a day.   Next appointment: 11/14/17 @ 9:00 AM  Preventive Care 65 Years and Older, Male Preventive care refers to lifestyle choices and visits with your health care provider that can promote health and wellness. What does preventive care include?  A yearly physical exam. This is also called an annual well check.  Dental exams once or twice a year.  Routine eye exams. Ask your health care provider how often you should have your eyes checked.  Personal lifestyle choices, including:  Daily care of your teeth and gums.  Regular physical activity.  Eating a healthy diet.  Avoiding tobacco and drug use.  Limiting alcohol use.  Practicing safe sex.  Taking low doses of aspirin every day.  Taking vitamin and mineral supplements as recommended by your health care provider. What happens during an annual well check? The services and screenings done by your health care provider during your annual well check will depend on your age, overall health, lifestyle risk factors, and family history of disease. Counseling  Your health care provider may ask you questions about your:  Alcohol use.  Tobacco  use.  Drug use.  Emotional well-being.  Home and relationship well-being.  Sexual activity.  Eating habits.  History of falls.  Memory and ability to understand (cognition).  Work and work Statistician. Screening  You may have the following tests or measurements:  Height, weight, and BMI.  Blood pressure.  Lipid and cholesterol levels. These may be checked every 5 years, or more frequently if you are over 83 years old.  Skin check.  Lung cancer screening. You may have this screening every year starting at age 28 if you have a 30-pack-year history of smoking and currently smoke or have quit within the past 15 years.  Fecal occult blood test (FOBT) of the stool. You may have this test every year starting at age 82.  Flexible sigmoidoscopy or colonoscopy. You may have a sigmoidoscopy every 5 years or a colonoscopy every 10 years starting at age 26.  Prostate cancer screening. Recommendations will vary depending on your family history and other risks.  Hepatitis C blood test.  Hepatitis B blood test.  Sexually transmitted disease (STD) testing.  Diabetes screening. This is done by checking your blood sugar (glucose) after you have not eaten for a while (fasting). You may have this done every 1-3 years.  Abdominal aortic aneurysm (AAA) screening. You may need this if you are a current or former smoker.  Osteoporosis. You may be screened starting at age 31 if you are at high risk. Talk with your health care provider about your test results, treatment options, and if necessary, the need for more tests. Vaccines  Your health care provider may recommend certain vaccines, such as:  Influenza vaccine.  This is recommended every year.  Tetanus, diphtheria, and acellular pertussis (Tdap, Td) vaccine. You may need a Td booster every 10 years.  Zoster vaccine. You may need this after age 67.  Pneumococcal 13-valent conjugate (PCV13) vaccine. One dose is recommended after age  71.  Pneumococcal polysaccharide (PPSV23) vaccine. One dose is recommended after age 86. Talk to your health care provider about which screenings and vaccines you need and how often you need them. This information is not intended to replace advice given to you by your health care provider. Make sure you discuss any questions you have with your health care provider. Document Released: 10/13/2015 Document Revised: 06/05/2016 Document Reviewed: 07/18/2015 Elsevier Interactive Patient Education  2017 [place] Prevention in the Home Falls can cause injuries. They can happen to people of all ages. There are many things you can do to make your home safe and to help prevent falls. What can I do on the outside of my home?  Regularly fix the edges of walkways and driveways and fix any cracks.  Remove anything that might make you trip as you walk through a door, such as a raised step or threshold.  Trim any bushes or trees on the path to your home.  Use bright outdoor lighting.  Clear any walking paths of anything that might make someone trip, such as rocks or tools.  Regularly check to see if handrails are loose or broken. Make sure that both sides of any steps have handrails.  Any raised decks and porches should have guardrails on the edges.  Have any leaves, snow, or ice cleared regularly.  Use sand or salt on walking paths during winter.  Clean up any spills in your garage right away. This includes oil or grease spills. What can I do in the bathroom?  Use night lights.  Install grab bars by the toilet and in the tub and shower. Do not use towel bars as grab bars.  Use non-skid mats or decals in the tub or shower.  If you need to sit down in the shower, use a plastic, non-slip stool.  Keep the floor dry. Clean up any water that spills on the floor as soon as it happens.  Remove soap buildup in the tub or shower regularly.  Attach bath mats securely with double-sided  non-slip rug tape.  Do not have throw rugs and other things on the floor that can make you trip. What can I do in the bedroom?  Use night lights.  Make sure that you have a light by your bed that is easy to reach.  Do not use any sheets or blankets that are too big for your bed. They should not hang down onto the floor.  Have a firm chair that has side arms. You can use this for support while you get dressed.  Do not have throw rugs and other things on the floor that can make you trip. What can I do in the kitchen?  Clean up any spills right away.  Avoid walking on wet floors.  Keep items that you use a lot in easy-to-reach places.  If you need to reach something above you, use a strong step stool that has a grab bar.  Keep electrical cords out of the way.  Do not use floor polish or wax that makes floors slippery. If you must use wax, use non-skid floor wax.  Do not have throw rugs and other things on the floor that can make  you trip. What can I do with my stairs?  Do not leave any items on the stairs.  Make sure that there are handrails on both sides of the stairs and use them. Fix handrails that are broken or loose. Make sure that handrails are as long as the stairways.  Check any carpeting to make sure that it is firmly attached to the stairs. Fix any carpet that is loose or worn.  Avoid having throw rugs at the top or bottom of the stairs. If you do have throw rugs, attach them to the floor with carpet tape.  Make sure that you have a light switch at the top of the stairs and the bottom of the stairs. If you do not have them, ask someone to add them for you. What else can I do to help prevent falls?  Wear shoes that:  Do not have high heels.  Have rubber bottoms.  Are comfortable and fit you well.  Are closed at the toe. Do not wear sandals.  If you use a stepladder:  Make sure that it is fully opened. Do not climb a closed stepladder.  Make sure that both  sides of the stepladder are locked into place.  Ask someone to hold it for you, if possible.  Clearly mark and make sure that you can see:  Any grab bars or handrails.  First and last steps.  Where the edge of each step is.  Use tools that help you move around (mobility aids) if they are needed. These include:  Canes.  Walkers.  Scooters.  Crutches.  Turn on the lights when you go into a dark area. Replace any light bulbs as soon as they burn out.  Set up your furniture so you have a clear path. Avoid moving your furniture around.  If any of your floors are uneven, fix them.  If there are any pets around you, be aware of where they are.  Review your medicines with your doctor. Some medicines can make you feel dizzy. This can increase your chance of falling. Ask your doctor what other things that you can do to help prevent falls. This information is not intended to replace advice given to you by your health care provider. Make sure you discuss any questions you have with your health care provider. Document Released: 07/13/2009 Document Revised: 02/22/2016 Document Reviewed: 10/21/2014 Elsevier Interactive Patient Education  2017 [employer].

## 2017-11-13 NOTE — Progress Notes (Signed)
Subjective:   ZACKARIE CHASON is a 84 y.o. male who presents for Medicare Annual/Subsequent preventive examination.  Review of Systems:  N/A  Cardiac Risk Factors include: advanced age (>94men, >72 women);male gender;obesity (BMI >30kg/m2);hypertension     Objective:    Vitals: BP (!) 156/72 (BP Location: Left Arm)   Pulse 64   Temp 97.7 F (36.5 C) (Oral)   Ht 5\' 10"  (1.778 m)   Wt 241 lb (109.3 kg)   BMI 34.58 kg/m   Body mass index is 34.58 kg/m.  Advanced Directives 11/13/2017 10/22/2016 05/08/2015 03/24/2015 03/23/2015  Does Patient Have a Medical Advance Directive? Yes Yes No;Yes Yes Yes  Type of Paramedic of [place];Living will Living will;Healthcare Power of Attorney Living will [place];Living will [place];Living will  Does patient want to make changes to medical advance directive? - - - No - Patient declined -  Copy of [place] in Chart? No - copy requested Yes - No - copy requested -  Would patient like information on creating a medical advance directive? - - No - patient declined information - -    Tobacco Social History   Tobacco Use  Smoking Status Former Smoker  . Types: Cigarettes  . Last attempt to quit: 09/30/1966  . Years since quitting: 51.1  Smokeless Tobacco Never Used     Counseling given: Not Answered   Clinical Intake:  Pre-visit preparation completed: Yes  Pain : No/denies pain Pain Score: 0-No pain     Nutritional Status: BMI > 30  Obese Nutritional Risks: None Diabetes: No  How often do you need to have someone help you when you read instructions, pamphlets, or other written materials from your doctor or pharmacy?: 1 - Never  Interpreter Needed?: No  Information entered by :: [facility], LPN  Past Medical History:  Diagnosis Date  . Arthritis   . Asthma   . C. difficile colitis 08/23/2016  . History of colon polyps    All benign per patient  report  . History of echocardiogram    a. 03/2015: a. EF 55-60%, no WMA, nl diastolic fxn, trivial TR, nl PASP  . Macular degeneration   . Migraine headache    Past Surgical History:  Procedure Laterality Date  . APPENDECTOMY    . appendectomy and adenoidectomy  1940's  . CATARACT EXTRACTION     x2  . DENTAL SURGERY  10/2010   Family History  Problem Relation Age of Onset  . Heart disease Mother    Social History   Socioeconomic History  . Marital status: Widowed    Spouse name: None  . Number of children: 2  . Years of education: None  . Highest education level: Bachelor's degree (e.g., BA, AB, BS)  Social Needs  . Financial resource strain: Not hard at all  . Food insecurity - worry: Never true  . Food insecurity - inability: Never true  . Transportation needs - medical: No  . Transportation needs - non-medical: No  Occupational History  . Occupation: retired  Tobacco Use  . Smoking status: Former Smoker    Types: Cigarettes    Last attempt to quit: 09/30/1966    Years since quitting: 51.1  . Smokeless tobacco: Never Used  Substance and Sexual Activity  . Alcohol use: Yes    Alcohol/week: 1.8 oz    Types: 3 Shots of liquor per week    Comment: occasionally wine  . Drug use:  No  . Sexual activity: No  Other Topics Concern  . None  Social History Narrative  . None    Outpatient Encounter Medications as of 11/13/2017  Medication Sig  . albuterol (PROAIR HFA) 108 (90 Base) MCG/ACT inhaler Inhale 2 puffs into the lungs every 6 (six) hours as needed for wheezing or shortness of breath.  Marland Kitchen amLODipine (NORVASC) 5 MG tablet Take 1 tablet (5 mg total) by mouth daily.  . clotrimazole-betamethasone (LOTRISONE) cream Apply 1 application topically 2 (two) times daily. (Patient taking differently: Apply 1 application topically 2 (two) times daily. )  . Fluticasone-Salmeterol (ADVAIR) 250-50 MCG/DOSE AEPB Inhale 1 puff into the lungs 2 (two) times daily.  . Influenza vac  split quadrivalent PF (FLUZONE HIGH-DOSE) 0.5 ML injection Fluzone High-Dose 2016-2017 (PF) 180 mcg/0.5 mL intramuscular syringe  TO BE ADMINISTERED BY PHARMACIST FOR IMMUNIZATION  . Multiple Vitamins-Minerals (PRESERVISION AREDS 2) CAPS Take 2 capsules by mouth daily.  . MULTIPLE VITAMINS-MINERALS PO Take by mouth. Reported on 12/06/2015  . Omega-3 Fatty Acids (FISH OIL) 1000 MG CAPS Take by mouth.  . ranitidine (ZANTAC) 150 MG tablet Take 150 mg by mouth daily.   No facility-administered encounter medications on file as of 11/13/2017.     Activities of Daily Living In your present state of health, do you have any difficulty performing the following activities: 11/13/2017  Hearing? N  Vision? Y  Comment Due to MD.  Difficulty concentrating or making decisions? Y  Walking or climbing stairs? N  Dressing or bathing? N  Doing errands, shopping? N  Preparing Food and eating ? N  Using the Toilet? N  In the past six months, have you accidently leaked urine? N  Do you have problems with loss of bowel control? Y  Comment Occasionally more recent. Possbily due to medication that pt is d/c.  Managing your Medications? N  Managing your Finances? N  Housekeeping or managing your Housekeeping? N  Some recent data might be hidden    Patient Care Team: Birdie Sons, MD as PCP - General (Family Medicine) Dingeldein, Remo Lipps, MD as Consulting Physician (Ophthalmology)   Assessment:   This is a routine wellness examination for [place].  Exercise Activities and Dietary recommendations Current Exercise Habits: Home exercise routine, Type of exercise: stretching, Time (Minutes): 20, Frequency (Times/Week): 3(to 4 days a week), Weekly Exercise (Minutes/Week): 60, Intensity: Mild, Exercise limited by: orthopedic condition(s)  Goals    . DIET - INCREASE WATER INTAKE     Recommend increasing water intake to 4-6 glasses a day.        Fall Risk Fall Risk  11/13/2017 10/22/2016 12/06/2015  Falls in  the past year? No No No   Is the patient's home free of loose throw rugs in walkways, pet beds, electrical cords, etc?   yes      Grab bars in the bathroom? no      Handrails on the stairs?   yes      Adequate lighting?   yes  Timed Get Up and Go Performed: N/A  Depression Screen PHQ 2/9 Scores 11/13/2017 10/22/2016 12/06/2015  PHQ - 2 Score 0 1 0    Cognitive Function: Pt declined screening today.     6CIT Screen 10/22/2016  What Year? 0 points  What month? 0 points  What time? 0 points  Count back from 20 0 points  Months in reverse 0 points  Repeat phrase 0 points  Total Score 0    Immunization History  Administered Date(s) Administered  . Influenza, High Dose Seasonal PF 08/04/2017  . Influenza-Unspecified 08/01/2015, 07/26/2016  . Pneumococcal Conjugate-13 11/30/2014  . Pneumococcal Polysaccharide-23 08/22/2008  . Zoster 12/06/2015    Qualifies for Shingles Vaccine? Due for Shingles vaccine. Declined my offer to administer today. Education has been provided regarding the importance of this vaccine. Pt has been advised to call her insurance company to determine her out of pocket expense. Advised she may also receive this vaccine at her local pharmacy or Health Dept. Verbalized acceptance and understanding.  Screening Tests Health Maintenance  Topic Date Due  . TETANUS/TDAP  04/27/2023  . INFLUENZA VACCINE  Completed  . PNA vac Low Risk Adult  Completed   Cancer Screenings: Lung: Low Dose CT Chest recommended if Age 70-80 years, 30 pack-year currently smoking OR have quit w/in 15years. Patient does not qualify. Colorectal: Up to date  Additional Screenings:  Hepatitis B/HIV/Syphillis: Pt declines today.  Hepatitis C Screening: Pt declines today.     Plan:  I have personally reviewed and addressed the Medicare Annual Wellness questionnaire and have noted the following in the patient's chart:  A. Medical and social history B. Use of alcohol, tobacco or illicit  drugs  C. Current medications and supplements D. Functional ability and status E.  Nutritional status F.  Physical activity G. Advance directives H. List of other physicians I.  Hospitalizations, surgeries, and ER visits in previous 12 months J.  [place] such as hearing and vision if needed, cognitive and depression L. Referrals and appointments - none  In addition, I have reviewed and discussed with patient certain preventive protocols, quality metrics, and best practice recommendations. A written personalized care plan for preventive services as well as general preventive health recommendations were provided to patient.  See attached scanned questionnaire for additional information.   Signed,  Fabio Neighbors, LPN Nurse Health Advisor   Nurse Recommendations: None.

## 2017-11-14 ENCOUNTER — Ambulatory Visit (INDEPENDENT_AMBULATORY_CARE_PROVIDER_SITE_OTHER): Payer: Medicare Other | Admitting: Family Medicine

## 2017-11-14 ENCOUNTER — Encounter: Payer: Self-pay | Admitting: Family Medicine

## 2017-11-14 VITALS — BP 142/68 | HR 58 | Temp 97.7°F | Resp 16 | Ht 70.0 in | Wt 241.0 lb

## 2017-11-14 DIAGNOSIS — J449 Chronic obstructive pulmonary disease, unspecified: Secondary | ICD-10-CM

## 2017-11-14 DIAGNOSIS — I440 Atrioventricular block, first degree: Secondary | ICD-10-CM

## 2017-11-14 DIAGNOSIS — N400 Enlarged prostate without lower urinary tract symptoms: Secondary | ICD-10-CM | POA: Diagnosis not present

## 2017-11-14 DIAGNOSIS — I10 Essential (primary) hypertension: Secondary | ICD-10-CM | POA: Diagnosis not present

## 2017-11-14 MED ORDER — TAMSULOSIN HCL 0.4 MG PO CAPS: 0.4000 mg | ORAL_CAPSULE | ORAL | 3 refills | Status: DC | PRN

## 2017-11-14 NOTE — Patient Instructions (Addendum)
The CDC recommends two doses of Shingrix (the shingles vaccine) separated by 2 to 6 months for adults age 82 years and older. I recommend checking with your insurance plan regarding coverage for this vaccine.    First-Degree Atrioventricular Block What is atrioventricular block? Atrioventricular (AV) block, also called heart block, is a problem with the system that controls how often the heart beats (heart rate) and the pattern of heart beats (heart rhythm). In this condition, the signals that travel from the heart's upper chambers (atria) to its lower chambers (ventricles) move too slowly or are interrupted. There are several types of heart block:  First-degree.  Second-degree.  Third-degree or complete.  What is first-degree heart block?  First-degree AV block is the least serious type of heart block. In this condition, the signals that control heart rate move too slowly. As a result, the heart may beat more slowly than normal. First-degree AV block can increase your risk of developing a type of irregular heartbeat called atrial fibrillation. What are the causes? This condition may be caused by:  Any condition that damages the system that controls the heart's rate and rhythm, such as a heart attack.  Overstimulation of the nerve that slows down heart rate (vagus nerve). This cause is common among well-conditioned athletes.  Some medicines that slow down heart rate, such as beta blockers or calcium channel blockers.  Surgery that damages the heart.  Some people are born with this condition (congenital heart block), but most people develop it over time. What increases the risk? The risk for this condition increases with age. You are also more likely to develop this condition if you have:  A history of heart attack.  Heart failure.  Coronary heart disease.  Inflammation of heart muscle (myocarditis).  Disease of heart muscle (cardiomyopathy).  Infection of the heart valves  (endocarditis).  Infections or diseases that affect the heart. These include: ? Lyme disease. ? Sarcoidosis. ? Hemochromatosis. ? Rheumatic fever. ? Muscle disorders including Lev disease and Lenegre disease.  Babies are more likely to be born with heart block if:  The mother has an autoimmune disease, such as lupus.  The baby is born with a heart defect that affects the heart's structure.  A parent was born with a heart defect.  What are the signs or symptoms? This condition usually does not cause any symptoms. How is this diagnosed? This condition may be diagnosed based on:  A physical exam.  Your medical history.  A measurement of your pulse or heartbeat.  Tests. These may include: ? An electrocardiogram (ECG). This test is done to check for problems with electrical activity in the heart. ? A Holter monitor or event monitor test. This test involves wearing a portable device that monitors your heart rate over time. ? An electrophysiology (EP) study. This test involves having long, thin tubes (catheters) placed in the heart. This test records electrical signals in the heart.  How is this treated? Usually, treatment is not needed for this condition. In some cases, treatment involves:  Treating an underlying condition, such as heart disease.  Changing or stopping any heart medicines that can cause heart block.  Follow these instructions at home:   Take over-the-counter and prescription medicines only as told by your health care provider.  Work with your health care provider to control lifestyle choices that increase your risk for heart disease. You may need to: ? Get regular exercise. Each week, try to get 150 minutes of moderate-intensity activity (  such as walking or yoga) or 75 minutes of vigorous activity (such as running or swimming). Ask your health care provider what type of exercise is safe for you. ? Eat a heart-healthy diet with fruits and vegetables, whole  grains, low-fat dairy products, and lean proteins like poultry and eggs. Your health care provider or diet and nutrition specialist (dietitian) can help you make healthy choices. ? Maintain a healthy weight. ? Limit alcohol intake to no more than 1 drink per day for nonpregnant women and 2 drinks per day for men. One drink equals 12 oz of beer, 5 oz of wine, or 1 oz of hard liquor.  Do not use any products that contain nicotine or tobacco, such as cigarettes and e-cigarettes. If you need help quitting, ask your health care provider.  Keep all follow-up visits as told by your health care provider. This is important. Contact a health care provider if:  You feel like your heart is skipping beats.  You feel more tired than normal.  You have swelling in your hands, feet, or lower legs. Get help right away if:  Your symptoms change or they get worse.  You develop new symptoms.  You have chest pain, especially if the pain: ? Feels like crushing or pressure. ? Spreads to your arms, back, neck, or jaw.  You feel short of breath.  You feel light-headed or weak.  You faint. Summary  First-degree AV block is the least serious type of heart block. In this condition, the signals that control heart rate move too slowly. As a result, the heart may beat more slowly than normal.  Usually, treatment is not needed for this condition. In some cases, you may need to change or stop medications that may be making the condition worse.  Healthy lifestyle choices such as exercising regularly, eating a healthy diet, and limiting alcohol are good for your heart. This information is not intended to replace advice given to you by your health care provider. Make sure you discuss any questions you have with your health care provider. Document Released: 08/29/2008 Document Revised: 05/03/2016 Document Reviewed: 05/03/2016 Elsevier Interactive Patient Education  2018 [employer].

## 2017-11-14 NOTE — Progress Notes (Signed)
Patient: Ryan Bentley Male    DOB: 02/28/1932   84 y.o.   MRN: [id number] Visit Date: 11/14/2017  Today's Provider: Lelon Huh, MD   Chief Complaint  Patient presents with  . Hypertension    follow up  . COPD    follow up   Subjective:     Patient had AWV with [facility] 11/13/2017.  HPI   Hypertension, follow-up:  BP Readings from Last 3 Encounters:  11/13/17 (!) 156/72  07/08/17 110/70  01/06/17 132/64    He was last seen for hypertension 4 months ago.  BP at that visit was 110/70. Management since that visit includes; no changes.He reports good compliance with treatment. He is not having side effects.  He is exercising. He is not adherent to low salt diet.   Outside blood pressures are not being checked. He is experiencing dyspnea.  Patient denies chest pain, chest pressure/discomfort, claudication, exertional chest pressure/discomfort, fatigue, irregular heart beat, lower extremity edema, near-syncope, orthopnea, palpitations, paroxysmal nocturnal dyspnea, syncope and tachypnea.   Cardiovascular risk factors include advanced age (older than 44 for men, 68 for women), hypertension and male gender.  Use of agents associated with hypertension: none.   ------------------------------------------------------------------------  Chronic obstructive pulmonary disease, unspecified COPD type ([place]) From 07/08/2017-refilled Advair. Patient reports good compliance with treatment, good tolerance and good symptom control. Patient states he rarely uses the Albuterol, but reports using Advair regularly.   He also reports he has been having more difficulty voiding frequently with hesitancy and urgency the last few months. He was previously taking tamsulosin which was effective and well tolerated and he would like to resume this medication.       Allergies  Allergen Reactions  . Iodine Itching  . Ivp Dye [Iodinated Diagnostic Agents] Anxiety    Agitation      Current Outpatient Medications:  .  albuterol (PROAIR HFA) 108 (90 Base) MCG/ACT inhaler, Inhale 2 puffs into the lungs every 6 (six) hours as needed for wheezing or shortness of breath., Disp: 1 Inhaler, Rfl: 3 .  amLODipine (NORVASC) 5 MG tablet, Take 1 tablet (5 mg total) by mouth daily., Disp: 90 tablet, Rfl: 4 .  clotrimazole-betamethasone (LOTRISONE) cream, Apply 1 application topically 2 (two) times daily. (Patient taking differently: Apply 1 application topically 2 (two) times daily. ), Disp: 15 g, Rfl: 3 .  Fluticasone-Salmeterol (ADVAIR) 250-50 MCG/DOSE AEPB, Inhale 1 puff into the lungs 2 (two) times daily., Disp: 3 each, Rfl: 2 .  Influenza vac split quadrivalent PF (FLUZONE HIGH-DOSE) 0.5 ML injection, Fluzone High-Dose 2016-2017 (PF) 180 mcg/0.5 mL intramuscular syringe  TO BE ADMINISTERED BY PHARMACIST FOR IMMUNIZATION, Disp: , Rfl:  .  Multiple Vitamins-Minerals (PRESERVISION AREDS 2) CAPS, Take 2 capsules by mouth daily., Disp: , Rfl:  .  MULTIPLE VITAMINS-MINERALS PO, Take by mouth. Reported on 12/06/2015, Disp: , Rfl:  .  Omega-3 Fatty Acids (FISH OIL) 1000 MG CAPS, Take by mouth., Disp: , Rfl:  .  ranitidine (ZANTAC) 150 MG tablet, Take 150 mg by mouth daily., Disp: , Rfl:   Review of Systems  Constitutional: Negative for appetite change, chills, fatigue and fever.  HENT: Negative for congestion, ear pain, hearing loss, nosebleeds and trouble swallowing.   Eyes: Positive for photophobia. Negative for pain and visual disturbance.  Respiratory: Positive for shortness of breath. Negative for cough and chest tightness.   Cardiovascular: Negative for chest pain, palpitations and leg swelling.  Gastrointestinal: Negative for abdominal pain, blood in  stool, constipation, diarrhea, nausea and vomiting.  Endocrine: Negative for polydipsia, polyphagia and polyuria.  Genitourinary: Positive for difficulty urinating, frequency and urgency. Negative for dysuria and flank pain.   Musculoskeletal: Positive for back pain. Negative for arthralgias, joint swelling, myalgias and neck stiffness.  Skin: Negative for color change, rash and wound.  Neurological: Negative for dizziness, tremors, seizures, speech difficulty, weakness, light-headedness and headaches.  Psychiatric/Behavioral: Positive for decreased concentration. Negative for behavioral problems, confusion, dysphoric mood and sleep disturbance. The patient is not nervous/anxious.   All other systems reviewed and are negative.   Social History   Tobacco Use  . Smoking status: Former Smoker    Types: Cigarettes    Last attempt to quit: 09/30/1966    Years since quitting: 51.1  . Smokeless tobacco: Never Used  Substance Use Topics  . Alcohol use: Yes    Alcohol/week: 1.8 oz    Types: 3 Shots of liquor per week    Comment: occasionally wine   Objective:   BP (!) 148/62 (BP Location: Right Arm, Cuff Size: Large)   Pulse (!) 58   Temp 97.7 F (36.5 C) (Oral)   Resp 16   Ht 5\' 10"  (1.778 m)   Wt 241 lb (109.3 kg)   SpO2 98% Comment: room air  BMI 34.58 kg/m  Vitals:   11/14/17 0901 11/14/17 0903  BP: (!) 158/64 (!) 148/62  Pulse: (!) 58   Resp: 16   Temp: 97.7 F (36.5 C)   TempSrc: Oral   SpO2: 98%   Weight: 241 lb (109.3 kg)   Height: 5\' 10"  (1.778 m)      Physical Exam   General Appearance:    Alert, cooperative, no distress  Eyes:    PERRL, conjunctiva/corneas clear, EOM's intact       Lungs:     Clear to auscultation bilaterally, respirations unlabored  Heart:    Regular rate and rhythm  Neurologic:   Awake, alert, oriented x 3. No apparent focal neurological           defect.       EKG: 1st degree AV block.     Assessment & Plan:      1. Essential hypertension SBP up somewhat today. Consider we are restarting alpha blocker below will continue current BP medications and follow up in 3-4 months for BP check.  - EKG 12-Lead - Comprehensive metabolic panel  2. Chronic  obstructive pulmonary disease, unspecified COPD type ([place]) Doing well on current maintenance inhalers.   3. Benign prostatic hyperplasia without lower urinary tract symptoms Did well with tamsulosin in the past which we will restart.  - tamsulosin (FLOMAX) 0.4 MG CAPS capsule; Take 1 capsule (0.4 mg total) by mouth as needed.  Dispense: 30 capsule; Refill: 3  4. First degree AV block Counseled on generally benign nature of this condition, but can be aggravated by several medications and to always notify medical provider before starting any new medications. Counseled to notify provider if he has any palpations, dyspnea, chest pains, or dizziness.        Lelon Huh, MD  [facility]

## 2017-11-15 LAB — COMPREHENSIVE METABOLIC PANEL
ALK PHOS: 89 IU/L (ref 39–117)
ALT: 13 IU/L (ref 0–44)
AST: 19 IU/L (ref 0–40)
Albumin/Globulin Ratio: 2 (ref 1.2–2.2)
Albumin: 4.1 g/dL (ref 3.5–4.7)
BUN/Creatinine Ratio: 16 (ref 10–24)
BUN: 14 mg/dL (ref 8–27)
Bilirubin Total: 0.5 mg/dL (ref 0.0–1.2)
CHLORIDE: 106 mmol/L (ref 96–106)
CO2: 21 mmol/L (ref 20–29)
CREATININE: 0.88 mg/dL (ref 0.76–1.27)
Calcium: 9.5 mg/dL (ref 8.6–10.2)
GFR calc Af Amer: 91 mL/min/{1.73_m2} (ref >59)
GFR calc non Af Amer: 79 mL/min/{1.73_m2} (ref >59)
GLOBULIN, TOTAL: 2.1 g/dL (ref 1.5–4.5)
GLUCOSE: 103 mg/dL — AB (ref 65–99)
POTASSIUM: 4.5 mmol/L (ref 3.5–5.2)
SODIUM: 141 mmol/L (ref 134–144)
Total Protein: 6.2 g/dL (ref 6.0–8.5)

## 2017-11-17 ENCOUNTER — Telehealth: Payer: Self-pay

## 2017-11-17 NOTE — Telephone Encounter (Signed)
Patient advised.KW 

## 2017-11-17 NOTE — Telephone Encounter (Signed)
-----   Message from Birdie Sons, MD sent at 11/15/2017  7:45 PM EST ----- labs are normal continue current medications. follow up in June as scheduled. can cancer 01-06-2018 appt

## 2017-11-17 NOTE — Telephone Encounter (Signed)
[id number] 

## 2017-12-09 DIAGNOSIS — H353221 Exudative age-related macular degeneration, left eye, with active choroidal neovascularization: Secondary | ICD-10-CM | POA: Diagnosis not present

## 2018-01-06 ENCOUNTER — Ambulatory Visit: Payer: Medicare Other | Admitting: Family Medicine

## 2018-01-13 DIAGNOSIS — H353221 Exudative age-related macular degeneration, left eye, with active choroidal neovascularization: Secondary | ICD-10-CM | POA: Diagnosis not present

## 2018-01-19 ENCOUNTER — Encounter: Payer: Self-pay | Admitting: Emergency Medicine

## 2018-01-19 ENCOUNTER — Emergency Department
Admission: EM | Admit: 2018-01-19 | Discharge: 2018-01-19 | Disposition: A | Discharge status: Home / Self Care | Payer: Medicare Other | Attending: Emergency Medicine | Admitting: Emergency Medicine

## 2018-01-19 ENCOUNTER — Other Ambulatory Visit: Payer: Self-pay

## 2018-01-19 ENCOUNTER — Emergency Department: Payer: Medicare Other

## 2018-01-19 DIAGNOSIS — J40 Bronchitis, not specified as acute or chronic: Secondary | ICD-10-CM

## 2018-01-19 DIAGNOSIS — H66001 Acute suppurative otitis media without spontaneous rupture of ear drum, right ear: Secondary | ICD-10-CM | POA: Diagnosis not present

## 2018-01-19 DIAGNOSIS — R0602 Shortness of breath: Secondary | ICD-10-CM | POA: Diagnosis not present

## 2018-01-19 DIAGNOSIS — J45901 Unspecified asthma with (acute) exacerbation: Secondary | ICD-10-CM | POA: Insufficient documentation

## 2018-01-19 DIAGNOSIS — I10 Essential (primary) hypertension: Secondary | ICD-10-CM | POA: Insufficient documentation

## 2018-01-19 DIAGNOSIS — Z87891 Personal history of nicotine dependence: Secondary | ICD-10-CM | POA: Diagnosis not present

## 2018-01-19 DIAGNOSIS — R079 Chest pain, unspecified: Secondary | ICD-10-CM | POA: Diagnosis not present

## 2018-01-19 DIAGNOSIS — R05 Cough: Secondary | ICD-10-CM | POA: Diagnosis not present

## 2018-01-19 DIAGNOSIS — Z79899 Other long term (current) drug therapy: Secondary | ICD-10-CM | POA: Diagnosis not present

## 2018-01-19 HISTORY — DX: Essential (primary) hypertension: I10

## 2018-01-19 LAB — CBC
HCT: 42.6 % (ref 40.0–52.0)
HEMOGLOBIN: 14.4 g/dL (ref 13.0–18.0)
MCH: 27.8 pg (ref 26.0–34.0)
MCHC: 33.8 g/dL (ref 32.0–36.0)
MCV: 82.1 fL (ref 80.0–100.0)
Platelets: 250 10*3/uL (ref 150–440)
RBC: 5.18 MIL/uL (ref 4.40–5.90)
RDW: 13.7 % (ref 11.5–14.5)
WBC: 15.9 10*3/uL — ABNORMAL HIGH (ref 3.8–10.6)

## 2018-01-19 LAB — BRAIN NATRIURETIC PEPTIDE: B Natriuretic Peptide: 190 pg/mL — ABNORMAL HIGH (ref 0.0–100.0)

## 2018-01-19 LAB — BASIC METABOLIC PANEL
ANION GAP: 9 (ref 5–15)
BUN: 12 mg/dL (ref 6–20)
CALCIUM: 8.9 mg/dL (ref 8.9–10.3)
CO2: 21 mmol/L — AB (ref 22–32)
Chloride: 107 mmol/L (ref 101–111)
Creatinine, Ser: 0.93 mg/dL (ref 0.61–1.24)
Glucose, Bld: 120 mg/dL — ABNORMAL HIGH (ref 65–99)
Potassium: 3.7 mmol/L (ref 3.5–5.1)
Sodium: 137 mmol/L (ref 135–145)

## 2018-01-19 LAB — TROPONIN I

## 2018-01-19 MED ORDER — ALBUTEROL SULFATE HFA 108 (90 BASE) MCG/ACT IN AERS: 2.0000 | INHALATION_SPRAY | Freq: Four times a day (QID) | RESPIRATORY_TRACT | 0 refills | Status: DC | PRN

## 2018-01-19 MED ORDER — BENZONATATE 100 MG PO CAPS: 200.0000 mg | ORAL_CAPSULE | Freq: Once | ORAL | Status: DC

## 2018-01-19 MED ORDER — AMOXICILLIN 500 MG PO TABS: 1000.0000 mg | ORAL_TABLET | Freq: Two times a day (BID) | ORAL | 0 refills | Status: AC

## 2018-01-19 MED ORDER — SPACER/AERO CHAMBER MOUTHPIECE MISC: 1.0000 [IU] | 0 refills | Status: AC | PRN

## 2018-01-19 MED ORDER — AMOXICILLIN 500 MG PO CAPS
1000.0000 mg | ORAL_CAPSULE | Freq: Once | ORAL | Status: AC
  Administered 2018-01-19: 1000 mg via ORAL
  Filled 2018-01-19: qty 2

## 2018-01-19 MED ORDER — IPRATROPIUM-ALBUTEROL 0.5-2.5 (3) MG/3ML IN SOLN
3.0000 mL | Freq: Once | RESPIRATORY_TRACT | Status: AC
  Administered 2018-01-19: 3 mL via RESPIRATORY_TRACT
  Filled 2018-01-19: qty 3

## 2018-01-19 MED ORDER — BENZONATATE 100 MG PO CAPS: 100.0000 mg | ORAL_CAPSULE | Freq: Four times a day (QID) | ORAL | 0 refills | Status: DC | PRN

## 2018-01-19 NOTE — ED Notes (Signed)
Pt discharged home after verbalizing understanding of discharge instructions; nad noted. 

## 2018-01-19 NOTE — ED Notes (Signed)
Reports "fighting" a cough for couple weeks and has been stressed due to sing in several choirs.  Patient with history of COPD.  Patient is able to speak in complete sentences without difficulty.

## 2018-01-19 NOTE — Discharge Instructions (Signed)
Please take all of your antibiotics as prescribed and follow-up with your primary care physician as needed.  Return to the emergency department sooner for any concerns whatsoever.  It was a pleasure to take care of you today, and thank you for coming to our emergency department.  If you have any questions or concerns before leaving please ask the nurse to grab me and I'm more than happy to go through your aftercare instructions again.  If you were prescribed any opioid pain medication today such as Norco, Vicodin, Percocet, morphine, hydrocodone, or oxycodone please make sure you do not drive when you are taking this medication as it can alter your ability to drive safely.  If you have any concerns once you are home that you are not improving or are in fact getting worse before you can make it to your follow-up appointment, please do not hesitate to call 911 and come back for further evaluation.  Darel Hong, MD  Results for orders placed or performed during the hospital encounter of 07/16/50  Basic metabolic panel  Result Value Ref Range   Sodium 137 135 - 145 mmol/L   Potassium 3.7 3.5 - 5.1 mmol/L   Chloride 107 101 - 111 mmol/L   CO2 21 (L) 22 - 32 mmol/L   Glucose, Bld 120 (H) 65 - 99 mg/dL   BUN 12 6 - 20 mg/dL   Creatinine, Ser 0.93 0.61 - 1.24 mg/dL   Calcium 8.9 8.9 - 10.3 mg/dL   GFR calc non Af Amer >60 >60 mL/min   GFR calc Af Amer >60 >60 mL/min   Anion gap 9 5 - 15  CBC  Result Value Ref Range   WBC 15.9 (H) 3.8 - 10.6 K/uL   RBC 5.18 4.40 - 5.90 MIL/uL   Hemoglobin 14.4 13.0 - 18.0 g/dL   HCT 42.6 40.0 - 52.0 %   MCV 82.1 80.0 - 100.0 fL   MCH 27.8 26.0 - 34.0 pg   MCHC 33.8 32.0 - 36.0 g/dL   RDW 13.7 11.5 - 14.5 %   Platelets 250 150 - 440 K/uL  Troponin I  Result Value Ref Range   Troponin I <0.03 <0.03 ng/mL  Brain natriuretic peptide  Result Value Ref Range   B Natriuretic Peptide 190.0 (H) 0.0 - 100.0 pg/mL   Dg Chest 2 View  Result Date:  01/19/2018 CLINICAL DATA:  Subacute onset of dry cough.  Shortness of breath. EXAM: CHEST - 2 VIEW COMPARISON:  Chest radiograph performed 03/23/2015, and CTA of the chest performed 03/24/2015 FINDINGS: The lungs are well-aerated. Mild vascular congestion is noted. Increased interstitial markings could reflect minimal interstitial edema. There is no evidence of focal opacification, pleural effusion or pneumothorax. The heart is borderline normal in size. No acute osseous abnormalities are seen. IMPRESSION: Mild vascular congestion. Increased interstitial markings could reflect minimal interstitial edema. Electronically Signed   By: Garald Balding M.D.   On: 01/19/2018 01:57

## 2018-01-19 NOTE — ED Provider Notes (Signed)
[facility] Emergency Department Provider Note  ____________________________________________   First MD Initiated Contact with Patient 01/19/18 0308     (approximate)  I have reviewed the triage vital signs and the nursing notes.   HISTORY  Chief Complaint Shortness of Breath   HPI Ryan Bentley is a 84 y.o. male who self presents to the emergency department with a dry cough for the past 2 weeks or so.  He feels like his symptoms have been "nagging" and not getting better.  He reports "fullness" in his right ear.  His primary concern is the right ear pain the cough.  His cough is nonproductive.  He does have sharp upper chest pain nonradiating worse with cough improved when not coughing.  He is able to lie completely flat.  He sleeps on one pillow.  No weight gain.  Past Medical History:  Diagnosis Date  . Arthritis   . Asthma   . C. difficile colitis 08/23/2016  . History of colon polyps    All benign per patient report  . History of echocardiogram    a. 03/2015: a. EF 55-60%, no WMA, nl diastolic fxn, trivial TR, nl PASP  . Hypertension   . Macular degeneration   . Migraine headache     Patient Active Problem List   Diagnosis Date Noted  . Macular degeneration 07/08/2017  . Bilateral low back pain without sciatica 07/08/2017  . Actinic keratosis 03/29/2015  . Liver cyst 03/29/2015  . Thyroid nodule 03/29/2015  . BPH (benign prostatic hyperplasia) 03/28/2015  . COPD (chronic obstructive pulmonary disease) ([place]) 03/28/2015  . History of colonic polyps 03/28/2015  . Migraine 03/28/2015  . BMI 34.0-34.9,adult 03/28/2015  . Essential hypertension 03/24/2015  . Obesity 03/23/2015  . Seborrhea 06/15/2007  . 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 GERD (gastroesophageal reflux disease) 09/30/1998    Past Surgical History:  Procedure Laterality Date  . APPENDECTOMY    . CATARACT EXTRACTION     x2  . DENTAL SURGERY   10/2010  . TONSILLECTOMY AND ADENOIDECTOMY  1940's    Prior to Admission medications   Medication Sig Start Date End Date Taking? Authorizing Provider  albuterol (PROAIR HFA) 108 (90 Base) MCG/ACT inhaler Inhale 2 puffs into the lungs every 6 (six) hours as needed for wheezing or shortness of breath. 05/13/16   Birdie Sons, MD  albuterol (PROVENTIL HFA;VENTOLIN HFA) 108 (90 Base) MCG/ACT inhaler Inhale 2 puffs into the lungs every 6 (six) hours as needed for wheezing or shortness of breath. 01/19/18   Darel Hong, MD  amLODipine (NORVASC) 5 MG tablet TAKE 1 TABLET DAILY 01/20/18   Birdie Sons, MD  amoxicillin (AMOXIL) 500 MG tablet Take 2 tablets (1,000 mg total) by mouth 2 (two) times daily for 7 days. 01/19/18 01/26/18  Darel Hong, MD  benzonatate (TESSALON PERLES) 100 MG capsule Take 1 capsule (100 mg total) by mouth every 6 (six) hours as needed for cough. 01/19/18 01/19/19  Darel Hong, MD  clotrimazole-betamethasone (LOTRISONE) cream Apply 1 application topically 2 (two) times daily. Patient taking differently: Apply 1 application topically 2 (two) times daily.  07/08/17   Birdie Sons, MD  Fluticasone-Salmeterol (ADVAIR) 250-50 MCG/DOSE AEPB Inhale 1 puff into the lungs 2 (two) times daily. 07/08/17   Birdie Sons, MD  Multiple Vitamins-Minerals (PRESERVISION AREDS 2) CAPS Take 2 capsules by mouth daily.    [provider]  MULTIPLE VITAMINS-MINERALS PO Take by mouth. Reported on 12/06/2015  [provider]  Omega-3 Fatty Acids (FISH OIL) 1000 MG CAPS Take by mouth.    [provider]  ranitidine (ZANTAC) 150 MG tablet Take 150 mg by mouth daily.    [provider]  Spacer/Aero Chamber Mouthpiece MISC 1 Units by Does not apply route every 4 (four) hours as needed (wheezing). 01/19/18   Darel Hong, MD  tamsulosin (FLOMAX) 0.4 MG CAPS capsule Take 1 capsule (0.4 mg total) by mouth as needed. 11/14/17   Birdie Sons, MD     Allergies Iodine and Ivp dye [iodinated diagnostic agents]  Family History  Problem Relation Age of Onset  . Heart disease Mother     Social History Social History   Tobacco Use  . Smoking status: Former Smoker    Types: Cigarettes    Last attempt to quit: 09/30/1966    Years since quitting: 51.3  . Smokeless tobacco: Never Used  Substance Use Topics  . Alcohol use: Yes    Alcohol/week: 1.8 oz    Types: 3 Shots of liquor per week    Comment: occasionally wine  . Drug use: No    Review of Systems Constitutional: No fever/chills Eyes: No visual changes. ENT: Positive for sore throat. Cardiovascular: Positive for chest pain. Respiratory: Positive for shortness of breath. Gastrointestinal: No abdominal pain.  No nausea, no vomiting.  No diarrhea.  No constipation. Genitourinary: Negative for dysuria. Musculoskeletal: Negative for back pain. Skin: Negative for rash. Neurological: Negative for headaches, focal weakness or numbness.   ____________________________________________   PHYSICAL EXAM:  VITAL SIGNS: ED Triage Vitals [01/19/18 0048]  Enc Vitals Group     BP 109/80     Pulse Rate 82     Resp 16     Temp 98 F (36.7 C)     Temp Source Oral     SpO2 96 %     Weight 233 lb (105.7 kg)     Height 5\' 9"  (1.753 m)     Head Circumference      Peak Flow      Pain Score 0     Pain Loc      Pain Edu?      Excl. in [place]?     Constitutional: Alert and oriented x4 pleasant cooperative speaks full clear sentences no diaphoresis Eyes: PERRL EOMI. Head: Atraumatic.  Right tympanic membrane erythematous and bulging left tympanic memory normal no mastoid tenderness Nose: No congestion/rhinnorhea. Mouth/Throat: No trismus Neck: No stridor.  Able lie completely flat with no JVD Cardiovascular: Normal rate, regular rhythm. Grossly normal heart sounds.  Good peripheral circulation. Respiratory: Slightly increased respiratory effort with mild expiratory wheezes  throughout Gastrointestinal: Soft nontender Musculoskeletal: No lower extremity edema legs equal in size Neurologic:  Normal speech and language. No gross focal neurologic deficits are appreciated. Skin:  Skin is warm, dry and intact. No rash noted. Psychiatric: Mood and affect are normal. Speech and behavior are normal.    ____________________________________________   DIFFERENTIAL includes but not limited to  Bronchitis, pneumothorax, pneumonia, otitis media, mastoiditis ____________________________________________   LABS (all labs ordered are listed, but only abnormal results are displayed)  Labs Reviewed  BASIC METABOLIC PANEL - Abnormal; Notable for the following components:      Result Value   CO2 21 (*)    Glucose, Bld 120 (*)    All other components within normal limits  CBC - Abnormal; Notable for the following components:   WBC 15.9 (*)    All other components  within normal limits  BRAIN NATRIURETIC PEPTIDE - Abnormal; Notable for the following components:   B Natriuretic Peptide 190.0 (*)    All other components within normal limits  TROPONIN I    Lab work reviewed by me with slightly elevated BNP although is nonspecific __________________________________________  EKG  ED ECG REPORT I, Darel Hong, the attending physician, personally viewed and interpreted this ECG.  Date: 01/22/2018 EKG Time:  Rate: 69 Rhythm: normal sinus rhythm QRS Axis: normal Intervals: Mobitz type I block ST/T Wave abnormalities: normal Narrative Interpretation: no evidence of acute ischemia  ____________________________________________  RADIOLOGY  Chest x-ray reviewed by me consistent with mild vascular congestion ____________________________________________   PROCEDURES  Procedure(s) performed: no  Procedures  Critical Care performed: no  Observation: no ____________________________________________   INITIAL IMPRESSION / ASSESSMENT AND PLAN / ED  COURSE  Pertinent labs & imaging results that were available during my care of the patient were reviewed by me and considered in my medical decision making (see chart for details).  Patient has had 2 weeks of persistent cough.  On exam he is somewhat wheezy which could represent vascular congestion versus bronchitis.  Given a breathing treatment and symptoms are improved.  Clinically he is not fluid overloaded at this time although he probably would benefit from an echocardiogram.  His right eardrum is erythematous and swollen consistent with acute otitis media.  I will cover him with amoxicillin as well as Tessalon Perles and albuterol inhaler and refer him back to primary care.  Strict return precautions given and patient verbalized understanding and agree with plan.      ____________________________________________   FINAL CLINICAL IMPRESSION(S) / ED DIAGNOSES  Final diagnoses:  Non-recurrent acute suppurative otitis media of right ear without spontaneous rupture of tympanic membrane  Bronchitis      NEW MEDICATIONS STARTED DURING THIS VISIT:  Discharge Medication List as of 01/19/2018  6:13 AM    START taking these medications   Details  !! albuterol (PROVENTIL HFA;VENTOLIN HFA) 108 (90 Base) MCG/ACT inhaler Inhale 2 puffs into the lungs every 6 (six) hours as needed for wheezing or shortness of breath., Starting Mon 01/19/2018, Print    amoxicillin (AMOXIL) 500 MG tablet Take 2 tablets (1,000 mg total) by mouth 2 (two) times daily for 7 days., Starting Mon 01/19/2018, Until Mon 01/26/2018, Print    benzonatate (TESSALON PERLES) 100 MG capsule Take 1 capsule (100 mg total) by mouth every 6 (six) hours as needed for cough., Starting Mon 01/19/2018, Until Tue 01/19/2019, Print    Spacer/Aero Chamber Mouthpiece MISC 1 Units by Does not apply route every 4 (four) hours as needed (wheezing)., Starting Mon 01/19/2018, Print     !! - Potential duplicate medications found. Please discuss  with provider.       Note:  This document was prepared using Dragon voice recognition software and may include unintentional dictation errors.     Darel Hong, MD 01/22/18 2141

## 2018-01-19 NOTE — ED Triage Notes (Signed)
Chronic dry cough for 2 weeks and has not gotten any better with over counter medications. Patient denies chest pain. Patient states he is short of breath all the time.

## 2018-01-19 NOTE — ED Notes (Signed)
Pt reports non productive cough x 2 weeks; denies chest pain or fever; talking in complete coherent sentences; lungs clear to auscultation

## 2018-01-19 NOTE — ED Notes (Signed)
Pt up with steady gait to look for bathroom; one across hall from treatment room is Out of order; pt ambulated back to room and shown toilet hidden in the treatment room; pt appreciative

## 2018-01-19 NOTE — ED Notes (Signed)
Answering pt's call bell; says now he has a runny nose-tissues given

## 2018-01-20 ENCOUNTER — Ambulatory Visit (INDEPENDENT_AMBULATORY_CARE_PROVIDER_SITE_OTHER): Payer: Medicare Other | Admitting: Family Medicine

## 2018-01-20 ENCOUNTER — Encounter: Payer: Self-pay | Admitting: Family Medicine

## 2018-01-20 ENCOUNTER — Other Ambulatory Visit: Payer: Self-pay | Admitting: Family Medicine

## 2018-01-20 VITALS — BP 132/62 | HR 79 | Temp 97.8°F | Resp 20 | Wt 231.0 lb

## 2018-01-20 DIAGNOSIS — J40 Bronchitis, not specified as acute or chronic: Secondary | ICD-10-CM | POA: Diagnosis not present

## 2018-01-20 DIAGNOSIS — I10 Essential (primary) hypertension: Secondary | ICD-10-CM

## 2018-01-20 DIAGNOSIS — R7989 Other specified abnormal findings of blood chemistry: Secondary | ICD-10-CM

## 2018-01-20 DIAGNOSIS — I459 Conduction disorder, unspecified: Secondary | ICD-10-CM

## 2018-01-20 NOTE — Progress Notes (Signed)
Patient: Ryan Bentley Male    DOB: [birth year]   84 y.o.   MRN: [id number] Visit Date: 01/20/2018  Today's Provider: Lelon Huh, MD   Chief Complaint  Patient presents with  . Follow-up   Subjective:    HPI   Follow up ER visit  Patient was seen in ER for Non-recurrent acute suppurative otitis media of right ear without spontaneous rupture of tympanic membrane and Bronchitis on 01/19/2018.  Treatment for this included; given amoxicillin, benzonatate and albuterol inhaler. He reports good compliance with treatment. He reports this condition is Improved. Patient states symptoms have slightly improved since yesterday, but he still feels short of breath and has a cough.  Chest XR from 01-19-2018 Mild vascular congestion. Increased interstitial markings could reflect minimal interstitial edema.  He was also noted to have mildly elevated BNP and EKG read as sinus tachycardia with second degree AV block (Mobitz I)  Results for orders placed or performed during the hospital encounter of 41/96/22  Basic metabolic panel  Result Value Ref Range   Sodium 137 135 - 145 mmol/L   Potassium 3.7 3.5 - 5.1 mmol/L   Chloride 107 101 - 111 mmol/L   CO2 21 (L) 22 - 32 mmol/L   Glucose, Bld 120 (H) 65 - 99 mg/dL   BUN 12 6 - 20 mg/dL   Creatinine, Ser 0.93 0.61 - 1.24 mg/dL   Calcium 8.9 8.9 - 10.3 mg/dL   GFR calc non Af Amer >60 >60 mL/min   GFR calc Af Amer >60 >60 mL/min   Anion gap 9 5 - 15  CBC  Result Value Ref Range   WBC 15.9 (H) 3.8 - 10.6 K/uL   RBC 5.18 4.40 - 5.90 MIL/uL   Hemoglobin 14.4 13.0 - 18.0 g/dL   HCT 42.6 40.0 - 52.0 %   MCV 82.1 80.0 - 100.0 fL   MCH 27.8 26.0 - 34.0 pg   MCHC 33.8 32.0 - 36.0 g/dL   RDW 13.7 11.5 - 14.5 %   Platelets 250 150 - 440 K/uL  Troponin I  Result Value Ref Range   Troponin I <0.03 <0.03 ng/mL  Brain natriuretic peptide  Result Value Ref Range   B Natriuretic Peptide 190.0 (H) 0.0 - 100.0 pg/mL     ------------------------------------------------------------------------------------ Wt Readings from Last 5 Encounters:  01/20/18 231 lb (104.8 kg)  01/19/18 233 lb (105.7 kg)  11/14/17 241 lb (109.3 kg)  11/13/17 241 lb (109.3 kg)  07/08/17 235 lb (106.6 kg)      Allergies  Allergen Reactions  . Iodine Itching  . Ivp Dye [Iodinated Diagnostic Agents] Anxiety    Agitation     Current Outpatient Medications:  .  albuterol (PROAIR HFA) 108 (90 Base) MCG/ACT inhaler, Inhale 2 puffs into the lungs every 6 (six) hours as needed for wheezing or shortness of breath., Disp: 1 Inhaler, Rfl: 3 .  albuterol (PROVENTIL HFA;VENTOLIN HFA) 108 (90 Base) MCG/ACT inhaler, Inhale 2 puffs into the lungs every 6 (six) hours as needed for wheezing or shortness of breath., Disp: 1 Inhaler, Rfl: 0 .  amLODipine (NORVASC) 5 MG tablet, TAKE 1 TABLET DAILY, Disp: 90 tablet, Rfl: 4 .  amoxicillin (AMOXIL) 500 MG tablet, Take 2 tablets (1,000 mg total) by mouth 2 (two) times daily for 7 days., Disp: 28 tablet, Rfl: 0 .  benzonatate (TESSALON PERLES) 100 MG capsule, Take 1 capsule (100 mg total) by mouth every 6 (six) hours as needed  for cough., Disp: 30 capsule, Rfl: 0 .  clotrimazole-betamethasone (LOTRISONE) cream, Apply 1 application topically 2 (two) times daily. (Patient taking differently: Apply 1 application topically 2 (two) times daily. ), Disp: 15 g, Rfl: 3 .  Fluticasone-Salmeterol (ADVAIR) 250-50 MCG/DOSE AEPB, Inhale 1 puff into the lungs 2 (two) times daily., Disp: 3 each, Rfl: 2 .  Multiple Vitamins-Minerals (PRESERVISION AREDS 2) CAPS, Take 2 capsules by mouth daily., Disp: , Rfl:  .  MULTIPLE VITAMINS-MINERALS PO, Take by mouth. Reported on 12/06/2015, Disp: , Rfl:  .  Omega-3 Fatty Acids (FISH OIL) 1000 MG CAPS, Take by mouth., Disp: , Rfl:  .  ranitidine (ZANTAC) 150 MG tablet, Take 150 mg by mouth daily., Disp: , Rfl:  .  Spacer/Aero Chamber Mouthpiece MISC, 1 Units by Does not apply  route every 4 (four) hours as needed (wheezing)., Disp: 1 each, Rfl: 0 .  tamsulosin (FLOMAX) 0.4 MG CAPS capsule, Take 1 capsule (0.4 mg total) by mouth as needed., Disp: 30 capsule, Rfl: 3  Review of Systems  Constitutional: Negative for appetite change, chills and fever.  Respiratory: Positive for cough, shortness of breath and wheezing. Negative for chest tightness.   Cardiovascular: Negative for chest pain and palpitations.  Gastrointestinal: Negative for abdominal pain, nausea and vomiting.    Social History   Tobacco Use  . Smoking status: Former Smoker    Types: Cigarettes    Last attempt to quit: 09/30/1966    Years since quitting: 51.3  . Smokeless tobacco: Never Used  Substance Use Topics  . Alcohol use: Yes    Alcohol/week: 1.8 oz    Types: 3 Shots of liquor per week    Comment: occasionally wine   Objective:   BP 132/62 (BP Location: Left Arm, Patient Position: Sitting, Cuff Size: Large)   Pulse 79   Temp 97.8 F (36.6 C) (Oral)   Resp 20   Wt 231 lb (104.8 kg)   SpO2 97% Comment: room air  BMI 34.11 kg/m     Physical Exam   General Appearance:    Alert, cooperative, no distress  Eyes:    PERRL, conjunctiva/corneas clear, EOM's intact       Lungs:     Clear to auscultation bilaterally, respirations unlabored  Heart:    Regular rate and rhythm  Neurologic:   Awake, alert, oriented x 3. No apparent focal neurological           defect.           Assessment & Plan:     1. Bronchitis Improved on current antibiotic.   2. Elevated brain natriuretic peptide (BNP) level He had normal EF on echo in 2016, but he did have mild concentric hypertrophy. Suspect some mild demand heart failure secondary to acute illness. Will recheck BNP, chest XR and WBC in 1-2 weeks. Consider repeating echo if not improved.        Lelon Huh, MD  [facility]

## 2018-01-27 ENCOUNTER — Telehealth: Payer: Self-pay | Admitting: Family Medicine

## 2018-01-27 DIAGNOSIS — D72829 Elevated white blood cell count, unspecified: Secondary | ICD-10-CM

## 2018-01-27 DIAGNOSIS — R7989 Other specified abnormal findings of blood chemistry: Secondary | ICD-10-CM

## 2018-01-27 DIAGNOSIS — R0602 Shortness of breath: Secondary | ICD-10-CM

## 2018-01-27 NOTE — Telephone Encounter (Signed)
Please check with patient and make sure feeling better since finishing antibiotic last week.   Also, need to recheck BNP and WBC this week since they were elevated in ER. Please print pended order and leave at lab.

## 2018-01-28 ENCOUNTER — Telehealth: Payer: Self-pay | Admitting: Family Medicine

## 2018-01-28 DIAGNOSIS — R7989 Other specified abnormal findings of blood chemistry: Secondary | ICD-10-CM | POA: Diagnosis not present

## 2018-01-28 DIAGNOSIS — D72829 Elevated white blood cell count, unspecified: Secondary | ICD-10-CM | POA: Diagnosis not present

## 2018-01-28 DIAGNOSIS — R0602 Shortness of breath: Secondary | ICD-10-CM | POA: Diagnosis not present

## 2018-01-28 MED ORDER — LEVOFLOXACIN 500 MG PO TABS: 500.0000 mg | ORAL_TABLET | Freq: Every day | ORAL | 0 refills | Status: DC

## 2018-01-28 NOTE — Telephone Encounter (Signed)
pt was in last week for upper resp infection.  He has finished his antibiotic and is having a very sore throat and cough.  He wants to know if he can get another antibiotic or something to help  His call back is [phone]  He uses [facility]

## 2018-01-28 NOTE — Telephone Encounter (Signed)
See other phone note. Patient was advised. Lab slip printed and placed at front desk.

## 2018-01-28 NOTE — Telephone Encounter (Signed)
Patient was advised.  

## 2018-01-28 NOTE — Telephone Encounter (Signed)
Patient states his cough is worse then it was when he was seen on 01/20/2018. Also patient states his throat feels like its on fire. Patein wanted to know if he can get another antibiotic? Please advise?

## 2018-01-28 NOTE — Telephone Encounter (Signed)
Have sent prescription for levofloxacin. Call if not much better in 3-4 days.

## 2018-01-29 LAB — CBC
Hematocrit: 38.9 % (ref 37.5–51.0)
Hemoglobin: 13.7 g/dL (ref 13.0–17.7)
MCH: 28.7 pg (ref 26.6–33.0)
MCHC: 35.2 g/dL (ref 31.5–35.7)
MCV: 81 fL (ref 79–97)
PLATELETS: 357 10*3/uL (ref 150–379)
RBC: 4.78 x10E6/uL (ref 4.14–5.80)
RDW: 13.7 % (ref 12.3–15.4)
WBC: 9.9 10*3/uL (ref 3.4–10.8)

## 2018-01-29 LAB — BRAIN NATRIURETIC PEPTIDE: BNP: 85.8 pg/mL (ref 0.0–100.0)

## 2018-02-17 DIAGNOSIS — H353221 Exudative age-related macular degeneration, left eye, with active choroidal neovascularization: Secondary | ICD-10-CM | POA: Diagnosis not present

## 2018-02-17 DIAGNOSIS — H353112 Nonexudative age-related macular degeneration, right eye, intermediate dry stage: Secondary | ICD-10-CM | POA: Diagnosis not present

## 2018-03-12 NOTE — Progress Notes (Signed)
Patient: Ryan Bentley Male    DOB: June 11, 1932   84 y.o.   MRN: [id number] Visit Date: 03/13/2018  Today's Provider: Lelon Huh, MD   Chief Complaint  Patient presents with  . Follow-up  . Hypertension  . COPD   Subjective:    HPI   Hypertension, follow-up:  BP Readings from Last 3 Encounters:  03/13/18 (!) 150/70  01/20/18 132/62  01/19/18 (!) 147/52     He was last seen for hypertension 3 months ago.  BP at that visit was 158/64. Management since that visit includes; restarted alpha blocker.He reports good compliance with treatment. He is not having side effects. none He is exercising. He is not adherent to low salt diet.   Outside blood pressures are not checking. He is experiencing none.  Patient denies none.   Cardiovascular risk factors include advanced age (older than 60 for men, 48 for women).  Use of agents associated with hypertension: none.       Wt Readings from Last 3 Encounters:  03/13/18 233 lb (105.7 kg)  01/20/18 231 lb (104.8 kg)  01/19/18 233 lb (105.7 kg)    ----------------------------------------------------------------  Chronic obstructive pulmonary disease, unspecified COPD type ([place]) From 11/14/2017-no changes were made. Is using generic advair every day and states his breathing has been very good.   Benign prostatic hyperplasia without lower urinary tract symptoms From 11/14/2017-restarted tamsulosin (FLOMAX) 0.4 MG CAPS capsule. He is tolerating well without any adverse effects, but has been out of medications for a few weeks.   He also states he has been under some stress lately since his daughter had been out of work and he has been helping the family quite a bit. He doesn't feel like he needs any anxiety medications at this point, but just wanted to make me aware.   He also reports his voice is not as strong as it used to be. 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    Allergies  Allergen Reactions  . Iodine Itching  . Ivp Dye [Iodinated Diagnostic Agents] Anxiety    Agitation     Current Outpatient Medications:  .  albuterol (PROAIR HFA) 108 (90 Base) MCG/ACT inhaler, Inhale 2 puffs into the lungs every 6 (six) hours as needed for wheezing or shortness of breath., Disp: 1 Inhaler, Rfl: 3 .  albuterol (PROVENTIL HFA;VENTOLIN HFA) 108 (90 Base) MCG/ACT inhaler, Inhale 2 puffs into the lungs every 6 (six) hours as needed for wheezing or shortness of breath., Disp: 1 Inhaler, Rfl: 0 .  amLODipine (NORVASC) 5 MG tablet, TAKE 1 TABLET DAILY, Disp: 90 tablet, Rfl: 4 .  clotrimazole-betamethasone (LOTRISONE) cream, Apply 1 application topically 2 (two) times daily. (Patient taking differently: Apply 1 application topically 2 (two) times daily. ), Disp: 15 g, Rfl: 3 .  Fluticasone-Salmeterol (ADVAIR) 250-50 MCG/DOSE AEPB, Inhale 1 puff into the lungs 2 (two) times daily., Disp: 3 each, Rfl: 2 .  Multiple Vitamins-Minerals (PRESERVISION AREDS 2) CAPS, Take 2 capsules by mouth daily., Disp: , Rfl:  .  MULTIPLE VITAMINS-MINERALS PO, Take by mouth. Reported on 12/06/2015, Disp: , Rfl:  .  Omega-3 Fatty Acids (FISH OIL) 1000 MG CAPS, Take by mouth., Disp: , Rfl:  .  ranitidine (ZANTAC) 150 MG tablet, Take 150 mg by mouth daily., Disp: , Rfl:  .  Spacer/Aero Chamber Mouthpiece MISC, 1 Units by Does not apply route every 4 (four)  hours as needed (wheezing)., Disp: 1 each, Rfl: 0 .  tamsulosin (FLOMAX) 0.4 MG CAPS capsule, Take 1 capsule (0.4 mg total) by mouth as needed., Disp: 30 capsule, Rfl: 3  Review of Systems  Constitutional: Negative for appetite change, chills and fever.  Respiratory: Negative for chest tightness, shortness of breath and wheezing.   Cardiovascular: Negative for chest pain and palpitations.  Gastrointestinal: Negative for abdominal pain, nausea and vomiting.    Social History   Tobacco Use  . Smoking status: Former Smoker      Types: Cigarettes    Last attempt to quit: 09/30/1966    Years since quitting: 51.4  . Smokeless tobacco: Never Used  Substance Use Topics  . Alcohol use: Yes    Alcohol/week: 1.8 oz    Types: 3 Shots of liquor per week    Comment: occasionally wine   Objective:   BP (!) 150/70 (BP Location: Right Arm, Patient Position: Sitting, Cuff Size: Large)   Pulse 69   Temp 97.8 F (36.6 C) (Oral)   Resp 16   Ht 5\' 10"  (1.778 m)   Wt 233 lb (105.7 kg)   SpO2 98%   BMI 33.43 kg/m  Vitals:   03/13/18 0817  BP: (!) 150/70  Pulse: 69  Resp: 16  Temp: 97.8 F (36.6 C)  TempSrc: Oral  SpO2: 98%  Weight: 233 lb (105.7 kg)  Height: 5\' 10"  (1.778 m)     Physical Exam   General Appearance:    Alert, cooperative, no distress  Eyes:    PERRL, conjunctiva/corneas clear, EOM's intact       Lungs:     Clear to auscultation bilaterally, respirations unlabored  Heart:    Regular rate and rhythm  Neurologic:   Awake, alert, oriented x 3. No apparent focal neurological           defect.          Assessment & Plan:     1. Benign prostatic hyperplasia without lower urinary tract symptoms Only taking tamsulosin prn and advised it works better if he takes every day. Being out of medication may also be related to increase in bp today. refill- tamsulosin (FLOMAX) 0.4 MG CAPS capsule; Take 1 capsule (0.4 mg total) by mouth as needed.  Dispense: 30 capsule; Refill: 5  2. Essential hypertension Usually well controlled. May be up today due to running out of tamsulosin and/or some anxiety at [place]. Continue current medications for now.   3. Chronic obstructive pulmonary disease, unspecified COPD type ([place]) Doing well with Advair.        Lelon Huh, MD  [facility]

## 2018-03-13 ENCOUNTER — Encounter: Payer: Self-pay | Admitting: Family Medicine

## 2018-03-13 ENCOUNTER — Ambulatory Visit (INDEPENDENT_AMBULATORY_CARE_PROVIDER_SITE_OTHER): Payer: Medicare Other | Admitting: Family Medicine

## 2018-03-13 VITALS — BP 148/68 | HR 69 | Temp 97.8°F | Resp 16 | Ht 70.0 in | Wt 233.0 lb

## 2018-03-13 DIAGNOSIS — I10 Essential (primary) hypertension: Secondary | ICD-10-CM

## 2018-03-13 DIAGNOSIS — J449 Chronic obstructive pulmonary disease, unspecified: Secondary | ICD-10-CM

## 2018-03-13 DIAGNOSIS — N400 Enlarged prostate without lower urinary tract symptoms: Secondary | ICD-10-CM | POA: Diagnosis not present

## 2018-03-13 MED ORDER — TAMSULOSIN HCL 0.4 MG PO CAPS: 0.4000 mg | ORAL_CAPSULE | ORAL | 5 refills | Status: DC | PRN

## 2018-03-31 DIAGNOSIS — H353221 Exudative age-related macular degeneration, left eye, with active choroidal neovascularization: Secondary | ICD-10-CM | POA: Diagnosis not present

## 2018-05-01 ENCOUNTER — Other Ambulatory Visit: Payer: Self-pay | Admitting: Family Medicine

## 2018-05-01 DIAGNOSIS — J449 Chronic obstructive pulmonary disease, unspecified: Secondary | ICD-10-CM

## 2018-05-01 MED ORDER — FLUTICASONE-SALMETEROL 250-50 MCG/DOSE IN AEPB: 1.0000 | INHALATION_SPRAY | Freq: Two times a day (BID) | RESPIRATORY_TRACT | 4 refills | Status: DC

## 2018-05-01 NOTE — Addendum Note (Signed)
Addended by: Birdie Sons on: 05/01/2018 12:24 PM   Modules accepted: Orders

## 2018-05-01 NOTE — Telephone Encounter (Signed)
[facility] pharmacy faxed a refill request for the following medication. Thanks CC  Fluticasone-Salmeterol (ADVAIR) 250-50 MCG/DOSE AEPB

## 2018-05-01 NOTE — Telephone Encounter (Signed)
Prescription was mistakingly sent to [facility] since it was the default pharmacy on the message. Can you please call [facility] and cancel prescription.

## 2018-05-19 DIAGNOSIS — H353221 Exudative age-related macular degeneration, left eye, with active choroidal neovascularization: Secondary | ICD-10-CM | POA: Diagnosis not present

## 2018-07-14 DIAGNOSIS — H353221 Exudative age-related macular degeneration, left eye, with active choroidal neovascularization: Secondary | ICD-10-CM | POA: Diagnosis not present

## 2018-09-08 DIAGNOSIS — H353221 Exudative age-related macular degeneration, left eye, with active choroidal neovascularization: Secondary | ICD-10-CM | POA: Diagnosis not present

## 2018-09-14 ENCOUNTER — Ambulatory Visit (INDEPENDENT_AMBULATORY_CARE_PROVIDER_SITE_OTHER): Payer: Medicare Other | Admitting: Family Medicine

## 2018-09-14 ENCOUNTER — Encounter: Payer: Self-pay | Admitting: Family Medicine

## 2018-09-14 VITALS — BP 147/77 | HR 71 | Temp 97.9°F | Resp 20 | Wt 245.0 lb

## 2018-09-14 DIAGNOSIS — J449 Chronic obstructive pulmonary disease, unspecified: Secondary | ICD-10-CM | POA: Diagnosis not present

## 2018-09-14 DIAGNOSIS — Z6835 Body mass index (BMI) 35.0-35.9, adult: Secondary | ICD-10-CM

## 2018-09-14 DIAGNOSIS — I441 Atrioventricular block, second degree: Secondary | ICD-10-CM | POA: Insufficient documentation

## 2018-09-14 DIAGNOSIS — I10 Essential (primary) hypertension: Secondary | ICD-10-CM

## 2018-09-14 MED ORDER — AMLODIPINE BESYLATE 10 MG PO TABS: 10.0000 mg | ORAL_TABLET | Freq: Every day | ORAL | 3 refills | Status: DC

## 2018-09-14 NOTE — Progress Notes (Signed)
Patient: Ryan Bentley Male    DOB: November 24, 1931   84 y.o.   MRN: [id number] Visit Date: 09/14/2018  Today's Provider: Lelon Huh, MD   Chief Complaint  Patient presents with  . Hypertension  . COPD   Subjective:     HPI  Hypertension, follow-up:  BP Readings from Last 3 Encounters:  09/14/18 (!) 147/77  03/13/18 (!) 148/68  01/20/18 132/62    He was last seen for hypertension 6 months ago.  BP at that visit was 150/70. Management since that visit was to continue same medications. Marland Kitchen He reports good compliance with treatment. He is not having side effects.  He is not exercising. He is not adherent to low salt diet.   Outside blood pressures are not checked. He is experiencing none.  Patient denies chest pain, chest pressure/discomfort, claudication, exertional chest pressure/discomfort, fatigue, irregular heart beat, lower extremity edema, near-syncope, orthopnea, palpitations, paroxysmal nocturnal dyspnea, syncope and tachypnea.   Cardiovascular risk factors include advanced age (older than 46 for men, 41 for women), hypertension and male gender.  Use of agents associated with hypertension: none.     Weight trend: increasing steadily Wt Readings from Last 3 Encounters:  09/14/18 245 lb (111.1 kg)  03/13/18 233 lb (105.7 kg)  01/20/18 231 lb (104.8 kg)    Current diet: well balanced  ------------------------------------------------------------------------ COPD: Patient was last seen for this problem 6 months ago, and no changes were made. Patient reports his breathing is stable with current inhaler.  BPH: Patient was last seen for this problem 6 months ago. Management during that visit includes advising patient to take Tamsulosin every day. Patient reports fair compliance with treatment. He is only taking Tamsulosin as needed.   He also reports episodes of leaking fluid from rectum occasionally. Has no sensation of having to have BM or any other  indications that he is going to have leaking. Otherwise no diarrhea, constipation, or blood in stool. He has remote history of colonoscopy, but last colonoscopy was reported to be normal around 2007.   Allergies  Allergen Reactions  . Iodine Itching  . Ivp Dye [Iodinated Diagnostic Agents] Anxiety    Agitation     Current Outpatient Medications:  .  albuterol (PROVENTIL HFA;VENTOLIN HFA) 108 (90 Base) MCG/ACT inhaler, Inhale 2 puffs into the lungs every 6 (six) hours as needed for wheezing or shortness of breath., Disp: 1 Inhaler, Rfl: 0 .  amLODipine (NORVASC) 5 MG tablet, TAKE 1 TABLET DAILY, Disp: 90 tablet, Rfl: 4 .  clotrimazole-betamethasone (LOTRISONE) cream, Apply 1 application topically 2 (two) times daily. (Patient taking differently: Apply 1 application topically 2 (two) times daily. ), Disp: 15 g, Rfl: 3 .  Fluticasone-Salmeterol (ADVAIR) 250-50 MCG/DOSE AEPB, Inhale 1 puff into the lungs 2 (two) times daily., Disp: 3 each, Rfl: 4 .  Multiple Vitamins-Minerals (PRESERVISION AREDS 2) CAPS, Take 2 capsules by mouth daily., Disp: , Rfl:  .  MULTIPLE VITAMINS-MINERALS PO, Take by mouth. Reported on 12/06/2015, Disp: , Rfl:  .  Omega-3 Fatty Acids (FISH OIL) 1000 MG CAPS, Take by mouth., Disp: , Rfl:  .  Spacer/Aero Chamber Mouthpiece MISC, 1 Units by Does not apply route every 4 (four) hours as needed (wheezing)., Disp: 1 each, Rfl: 0 .  tamsulosin (FLOMAX) 0.4 MG CAPS capsule, Take 1 capsule (0.4 mg total) by mouth as needed., Disp: 30 capsule, Rfl: 5 .  ranitidine (ZANTAC) 150 MG tablet, Take 150 mg by mouth daily., Disp: ,  Rfl:   Review of Systems  Constitutional: Negative for appetite change, chills and fever.  Respiratory: Positive for shortness of breath. Negative for chest tightness and wheezing.   Cardiovascular: Negative for chest pain and palpitations.  Gastrointestinal: Negative for abdominal pain, nausea and vomiting.    Social History   Tobacco Use  . Smoking  status: Former Smoker    Types: Cigarettes    Last attempt to quit: 09/30/1966    Years since quitting: 51.9  . Smokeless tobacco: Never Used  Substance Use Topics  . Alcohol use: Yes    Alcohol/week: 3.0 standard drinks    Types: 3 Shots of liquor per week    Comment: occasionally wine      Objective:   BP (!) 147/77 (BP Location: Left Arm, Patient Position: Sitting, Cuff Size: Large)   Pulse 71   Temp 97.9 F (36.6 C) (Oral)   Resp 20   Wt 245 lb (111.1 kg)   SpO2 99% Comment: room air  BMI 35.15 kg/m  Vitals:   09/14/18 0828  BP: (!) 147/77  Pulse: 71  Resp: 20  Temp: 97.9 F (36.6 C)  TempSrc: Oral  SpO2: 99%  Weight: 245 lb (111.1 kg)     Physical Exam  General Appearance:    Alert, cooperative, no distress, obese  Eyes:    PERRL, conjunctiva/corneas clear, EOM's intact       Lungs:     Clear to auscultation bilaterally, respirations unlabored  Heart:    Regular rate and rhythm  Neurologic:   Awake, alert, oriented x 3. No apparent focal neurological           defect.          Assessment & Plan    1. Essential hypertension increase- amLODipine (NORVASC) 10 MG tablet; Take 1 tablet (10 mg total) by mouth daily.  Dispense: 90 tablet; Refill: 3  2. Chronic obstructive pulmonary disease, unspecified COPD type ([facility]) Well controlled with advair and occasional albuterol inhalers.   3. BMI 35.0-35.9,adult Counseled regarding prudent diet and regular exercise.   4. Rectal discharge Is currently a minor nascence. Discussed GI evaluation, particularly since he hasn't had colonoscopy in over 10 years.  He will call back for referral if sx progress or become more bothersome.     Lelon Huh, MD  [facility]

## 2018-09-14 NOTE — Patient Instructions (Signed)
   Increase amlodipine to 10mg  a day, you can take 2 5mg  tablets a day until the mail order prescription arrives.    Call for referral to gastroenterology if you continue to have problems with bowels.

## 2018-10-20 ENCOUNTER — Encounter: Payer: Self-pay | Admitting: Physician Assistant

## 2018-10-20 ENCOUNTER — Ambulatory Visit (INDEPENDENT_AMBULATORY_CARE_PROVIDER_SITE_OTHER): Payer: Medicare Other | Admitting: Physician Assistant

## 2018-10-20 VITALS — BP 145/71 | HR 75 | Temp 97.5°F | Resp 16 | Wt 240.6 lb

## 2018-10-20 DIAGNOSIS — J441 Chronic obstructive pulmonary disease with (acute) exacerbation: Secondary | ICD-10-CM

## 2018-10-20 MED ORDER — DOXYCYCLINE HYCLATE 100 MG PO TABS: 100.0000 mg | ORAL_TABLET | Freq: Two times a day (BID) | ORAL | 0 refills | Status: AC

## 2018-10-20 MED ORDER — PREDNISONE 20 MG PO TABS: 20.0000 mg | ORAL_TABLET | Freq: Every day | ORAL | 0 refills | Status: AC

## 2018-10-20 MED ORDER — ALBUTEROL SULFATE HFA 108 (90 BASE) MCG/ACT IN AERS: 2.0000 | INHALATION_SPRAY | Freq: Four times a day (QID) | RESPIRATORY_TRACT | 2 refills | Status: DC | PRN

## 2018-10-20 NOTE — Patient Instructions (Signed)

## 2018-10-20 NOTE — Progress Notes (Signed)
Patient: Ryan Bentley Male    DOB: 29-Sep-1932   85 y.o.   MRN: [id number] Visit Date: 10/20/2018  Today's Provider: Trinna Post, PA-C   Chief Complaint  Patient presents with  . URI   Subjective:     HPI  Upper Respiratory Infection: Patient with history of COPD on advair daily  complains of symptoms of a URI. Symptoms include productive cough. Onset of symptoms was 4 days ago, gradually worsening since that time. He also c/o congestion for the past 4 days .  He is drinking plenty of fluids. Evaluation to date: none. Treatment to date: none.  Wt Readings from Last 3 Encounters:  10/20/18 240 lb 9.6 oz (109.1 kg)  09/14/18 245 lb (111.1 kg)  03/13/18 233 lb (105.7 kg)     Allergies  Allergen Reactions  . Iodine Itching  . Ivp Dye [Iodinated Diagnostic Agents] Anxiety    Agitation     Current Outpatient Medications:  .  albuterol (PROVENTIL HFA;VENTOLIN HFA) 108 (90 Base) MCG/ACT inhaler, Inhale 2 puffs into the lungs every 6 (six) hours as needed for wheezing or shortness of breath., Disp: 1 Inhaler, Rfl: 0 .  amLODipine (NORVASC) 10 MG tablet, Take 1 tablet (10 mg total) by mouth daily., Disp: 90 tablet, Rfl: 3 .  clotrimazole-betamethasone (LOTRISONE) cream, Apply 1 application topically 2 (two) times daily. (Patient taking differently: Apply 1 application topically 2 (two) times daily. ), Disp: 15 g, Rfl: 3 .  Fluticasone-Salmeterol (ADVAIR) 250-50 MCG/DOSE AEPB, Inhale 1 puff into the lungs 2 (two) times daily., Disp: 3 each, Rfl: 4 .  Multiple Vitamins-Minerals (PRESERVISION AREDS 2) CAPS, Take 2 capsules by mouth daily., Disp: , Rfl:  .  MULTIPLE VITAMINS-MINERALS PO, Take by mouth. Reported on 12/06/2015, Disp: , Rfl:  .  Spacer/Aero Chamber Mouthpiece MISC, 1 Units by Does not apply route every 4 (four) hours as needed (wheezing)., Disp: 1 each, Rfl: 0 .  tamsulosin (FLOMAX) 0.4 MG CAPS capsule, Take 1 capsule (0.4 mg total) by mouth as needed., Disp: 30  capsule, Rfl: 5  Review of Systems  HENT: Positive for congestion.   Respiratory: Positive for cough.     Social History   Tobacco Use  . Smoking status: Former Smoker    Types: Cigarettes    Last attempt to quit: 09/30/1966    Years since quitting: 52.0  . Smokeless tobacco: Never Used  Substance Use Topics  . Alcohol use: Yes    Alcohol/week: 3.0 standard drinks    Types: 3 Shots of liquor per week    Comment: occasionally wine      Objective:   BP (!) 145/71 (BP Location: Left Arm, Patient Position: Sitting, Cuff Size: Normal)   Pulse 75   Temp (!) 97.5 F (36.4 C) (Oral)   Resp 16   Wt 240 lb 9.6 oz (109.1 kg)   SpO2 98%   BMI 34.52 kg/m  Vitals:   10/20/18 1144  BP: (!) 145/71  Pulse: 75  Resp: 16  Temp: (!) 97.5 F (36.4 C)  TempSrc: Oral  SpO2: 98%  Weight: 240 lb 9.6 oz (109.1 kg)     Physical Exam Constitutional:      Appearance: Normal appearance.  HENT:     Right Ear: Tympanic membrane and ear canal normal.     Left Ear: Tympanic membrane and ear canal normal.     Nose: Nose normal.     Mouth/Throat:     Mouth:  Mucous membranes are moist.     Pharynx: Oropharynx is clear.  Cardiovascular:     Rate and Rhythm: Normal rate and regular rhythm.     Heart sounds: Normal heart sounds.  Pulmonary:     Effort: Pulmonary effort is normal.     Breath sounds: Wheezing and rhonchi present.     Comments: Decreased air entry bilaterally.  Skin:    General: Skin is warm and dry.  Neurological:     Mental Status: He is alert and oriented to person, place, and time.  Psychiatric:        Mood and Affect: Mood normal.        Behavior: Behavior normal.         Assessment & Plan    1. COPD exacerbation (HCC)  - doxycycline (VIBRA-TABS) 100 MG tablet; Take 1 tablet (100 mg total) by mouth 2 (two) times daily for 7 days.  Dispense: 14 tablet; Refill: 0 - predniSONE (DELTASONE) 20 MG tablet; Take 1 tablet (20 mg total) by mouth daily with breakfast for  5 days.  Dispense: 5 tablet; Refill: 0 - albuterol (PROVENTIL HFA;VENTOLIN HFA) 108 (90 Base) MCG/ACT inhaler; Inhale 2 puffs into the lungs every 6 (six) hours as needed for wheezing or shortness of breath.  Dispense: 1 Inhaler; Refill: 2  Return if symptoms worsen or fail to improve.  The entirety of the information documented in the History of Present Illness, Review of Systems and Physical Exam were personally obtained by me. Portions of this information were initially documented by Lynford Humphrey, CMA and reviewed by me for thoroughness and accuracy.      Trinna Post, PA-C  [facility]

## 2018-11-05 ENCOUNTER — Telehealth: Payer: Self-pay | Admitting: Family Medicine

## 2018-11-05 NOTE — Telephone Encounter (Signed)
Patient states he is having the following symptoms: throat burning and cough with green mucus. He is asking if you could refill the Doxycycline again to see if it will get rid of the symptoms.Please advise.

## 2018-11-05 NOTE — Telephone Encounter (Signed)
He was seen in clinic over 2.5 weeks ago, he would have to be re-evaluated.

## 2018-11-05 NOTE — Telephone Encounter (Signed)
Patient was advised and appointment schedule for 11/06/2018 @ 2:40 pm with Dr.Fisher.

## 2018-11-05 NOTE — Telephone Encounter (Signed)
Pt's COPD symptoms have returned.  Requesting the medications to be refilled at : [id number] - [place], [place] (Phone) [phone] (Fax)   Thanks, [facility]

## 2018-11-06 ENCOUNTER — Encounter: Payer: Self-pay | Admitting: Family Medicine

## 2018-11-06 ENCOUNTER — Ambulatory Visit (INDEPENDENT_AMBULATORY_CARE_PROVIDER_SITE_OTHER): Payer: Medicare Other | Admitting: Family Medicine

## 2018-11-06 VITALS — BP 144/78 | HR 84 | Temp 97.8°F | Resp 20 | Wt 240.0 lb

## 2018-11-06 DIAGNOSIS — J40 Bronchitis, not specified as acute or chronic: Secondary | ICD-10-CM | POA: Diagnosis not present

## 2018-11-06 MED ORDER — DOXYCYCLINE HYCLATE 100 MG PO TABS: 100.0000 mg | ORAL_TABLET | Freq: Two times a day (BID) | ORAL | 0 refills | Status: AC

## 2018-11-06 MED ORDER — PREDNISONE 10 MG PO TABS: ORAL_TABLET | ORAL | 0 refills | Status: AC

## 2018-11-06 NOTE — Progress Notes (Signed)
Patient: Ryan Bentley Male    DOB: [birth year]   85 y.o.   MRN: [id number] Visit Date: 11/06/2018  Today's Provider: Lelon Huh, MD   Chief Complaint  Patient presents with  . Cough   Subjective:     Cough  This is a new problem. Episode onset: 3 weeks ago; treated for COPD exacerbation on 10/20/2018 with doxycycline and prednisone. The problem has been gradually worsening. The cough is productive of sputum. Associated symptoms include a sore throat and shortness of breath. Pertinent negatives include no chest pain, chills, ear congestion, ear pain, fever, headaches, postnasal drip, rhinorrhea or wheezing. Treatments tried: Abluterol inhaler. His past medical history is significant for COPD.  He states sx had resolved by the time he finished doxy and prednisone, but started to come back within a few days of finishing. Sx not as bad now as they were at last visit, but states he always needs 10 days of antibiotic.    Allergies  Allergen Reactions  . Iodine Itching  . Ivp Dye [Iodinated Diagnostic Agents] Anxiety    Agitation     Current Outpatient Medications:  .  albuterol (PROVENTIL HFA;VENTOLIN HFA) 108 (90 Base) MCG/ACT inhaler, Inhale 2 puffs into the lungs every 6 (six) hours as needed for wheezing or shortness of breath., Disp: 1 Inhaler, Rfl: 2 .  amLODipine (NORVASC) 10 MG tablet, Take 1 tablet (10 mg total) by mouth daily., Disp: 90 tablet, Rfl: 3 .  Fluticasone-Salmeterol (ADVAIR) 250-50 MCG/DOSE AEPB, Inhale 1 puff into the lungs 2 (two) times daily., Disp: 3 each, Rfl: 4 .  Multiple Vitamins-Minerals (PRESERVISION AREDS 2) CAPS, Take 2 capsules by mouth daily., Disp: , Rfl:  .  MULTIPLE VITAMINS-MINERALS PO, Take by mouth. Reported on 12/06/2015, Disp: , Rfl:  .  Spacer/Aero Chamber Mouthpiece MISC, 1 Units by Does not apply route every 4 (four) hours as needed (wheezing)., Disp: 1 each, Rfl: 0 .  clotrimazole-betamethasone (LOTRISONE) cream, Apply 1  application topically 2 (two) times daily. (Patient not taking: Reported on 11/06/2018), Disp: 15 g, Rfl: 3 .  tamsulosin (FLOMAX) 0.4 MG CAPS capsule, Take 1 capsule (0.4 mg total) by mouth as needed. (Patient not taking: Reported on 11/06/2018), Disp: 30 capsule, Rfl: 5  Review of Systems  Constitutional: Negative for appetite change, chills, diaphoresis, fatigue and fever.  HENT: Positive for congestion (chest congestion), sneezing, sore throat and trouble swallowing. Negative for ear pain, postnasal drip, rhinorrhea, sinus pressure and sinus pain.   Eyes: Negative for discharge and itching.  Respiratory: Positive for cough (productive with green sputum) and shortness of breath. Negative for chest tightness and wheezing.   Cardiovascular: Negative for chest pain and palpitations.  Gastrointestinal: Negative for abdominal pain, nausea and vomiting.  Neurological: Negative for headaches.    Social History   Tobacco Use  . Smoking status: Former Smoker    Types: Cigarettes    Last attempt to quit: 09/30/1966    Years since quitting: 52.1  . Smokeless tobacco: Never Used  Substance Use Topics  . Alcohol use: Yes    Alcohol/week: 3.0 standard drinks    Types: 3 Shots of liquor per week    Comment: occasionally wine      Objective:   BP (!) 144/78 (BP Location: Left Arm, Patient Position: Sitting, Cuff Size: Large)   Pulse 84   Temp 97.8 F (36.6 C) (Oral)   Resp 20   Wt 240 lb (108.9 kg)   SpO2 98%  Comment: room air  BMI 34.44 kg/m  Vitals:   11/06/18 1438  BP: (!) 144/78  Pulse: 84  Resp: 20  Temp: 97.8 F (36.6 C)  TempSrc: Oral  SpO2: 98%  Weight: 240 lb (108.9 kg)    Physical Exam  General Appearance:    Alert, cooperative, no distress  HENT:   ENT exam normal, no neck nodes or sinus tenderness  Eyes:    PERRL, conjunctiva/corneas clear, EOM's intact       Lungs:     Rare expiatory wheeze, no rales, respirations unlabored  Heart:    Regular rate and rhythm    Neurologic:   Awake, alert, oriented x 3. No apparent focal neurological           defect.          Assessment & Plan    1. Bronchitis  - doxycycline (VIBRA-TABS) 100 MG tablet; Take 1 tablet (100 mg total) by mouth 2 (two) times daily for 10 days.  Dispense: 20 tablet; Refill: 0 - predniSONE (DELTASONE) 10 MG tablet; 6 tablets for 2 days, then 5 for 2 days, then 4 for 2 days, then 3 for 2 days, then 2 for 2 days, then 1 for 2 days.  Dispense: 42 tablet; Refill: 0  Call if symptoms change or if not rapidly improving.        Lelon Huh, MD  [facility]

## 2018-11-06 NOTE — Patient Instructions (Signed)
.   Please review the attached list of medications and notify my office if there are any errors.   . Please bring all of your medications to every appointment so we can make sure that our medication list is the same as yours.   

## 2018-11-15 DIAGNOSIS — I4430 Unspecified atrioventricular block: Secondary | ICD-10-CM | POA: Diagnosis not present

## 2018-11-15 DIAGNOSIS — I499 Cardiac arrhythmia, unspecified: Secondary | ICD-10-CM | POA: Diagnosis not present

## 2018-11-15 DIAGNOSIS — Z87891 Personal history of nicotine dependence: Secondary | ICD-10-CM | POA: Diagnosis not present

## 2018-11-15 DIAGNOSIS — I10 Essential (primary) hypertension: Secondary | ICD-10-CM | POA: Diagnosis present

## 2018-11-15 DIAGNOSIS — I351 Nonrheumatic aortic (valve) insufficiency: Secondary | ICD-10-CM | POA: Diagnosis not present

## 2018-11-15 DIAGNOSIS — I517 Cardiomegaly: Secondary | ICD-10-CM | POA: Diagnosis not present

## 2018-11-15 DIAGNOSIS — J111 Influenza due to unidentified influenza virus with other respiratory manifestations: Secondary | ICD-10-CM | POA: Diagnosis not present

## 2018-11-15 DIAGNOSIS — I420 Dilated cardiomyopathy: Secondary | ICD-10-CM | POA: Diagnosis not present

## 2018-11-15 DIAGNOSIS — I5030 Unspecified diastolic (congestive) heart failure: Secondary | ICD-10-CM | POA: Diagnosis not present

## 2018-11-15 DIAGNOSIS — Z91041 Radiographic dye allergy status: Secondary | ICD-10-CM | POA: Diagnosis not present

## 2018-11-15 DIAGNOSIS — R0602 Shortness of breath: Secondary | ICD-10-CM | POA: Diagnosis not present

## 2018-11-15 DIAGNOSIS — J441 Chronic obstructive pulmonary disease with (acute) exacerbation: Secondary | ICD-10-CM | POA: Diagnosis not present

## 2018-11-15 DIAGNOSIS — I441 Atrioventricular block, second degree: Secondary | ICD-10-CM | POA: Diagnosis present

## 2018-11-16 ENCOUNTER — Ambulatory Visit: Payer: Self-pay

## 2018-11-20 ENCOUNTER — Ambulatory Visit (INDEPENDENT_AMBULATORY_CARE_PROVIDER_SITE_OTHER): Payer: Medicare Other

## 2018-11-20 ENCOUNTER — Ambulatory Visit: Payer: Self-pay

## 2018-11-20 VITALS — BP 146/64 | HR 71 | Temp 97.7°F | Ht 70.0 in | Wt 249.6 lb

## 2018-11-20 DIAGNOSIS — Z0000 Encounter for general adult medical examination without abnormal findings: Secondary | ICD-10-CM

## 2018-11-20 NOTE — Patient Instructions (Addendum)
[name] , Thank you for taking time to come for your Medicare Wellness Visit. I appreciate your ongoing commitment to your health goals. Please review the following plan we discussed and let me know if I can assist you in the future.   Screening recommendations/referrals: Colonoscopy: No longer required.  Recommended yearly ophthalmology/optometry visit for glaucoma screening and checkup Recommended yearly dental visit for hygiene and checkup  Vaccinations: Influenza vaccine: Up to date Pneumococcal vaccine: Completed series Tdap vaccine: Up to date, due 03/2023 Shingles vaccine: Pt declines today.     Advanced directives: Please bring a copy of your POA (Power of Attorney) and/or Living Will to your next appointment.   Conditions/risks identified: Obesity- recommend to monitor junk food intake and to limit the amount of salt and sweets in daily diet to help aid in weight loss.   Next appointment: 12/15/18 with Dr Caryn Section.   Preventive Care 35 Years and Older, Male Preventive care refers to lifestyle choices and visits with your health care provider that can promote health and wellness. What does preventive care include?  A yearly physical exam. This is also called an annual well check.  Dental exams once or twice a year.  Routine eye exams. Ask your health care provider how often you should have your eyes checked.  Personal lifestyle choices, including:  Daily care of your teeth and gums.  Regular physical activity.  Eating a healthy diet.  Avoiding tobacco and drug use.  Limiting alcohol use.  Practicing safe sex.  Taking low doses of aspirin every day.  Taking vitamin and mineral supplements as recommended by your health care provider. What happens during an annual well check? The services and screenings done by your health care provider during your annual well check will depend on your age, overall health, lifestyle risk factors, and family history of  disease. Counseling  Your health care provider may ask you questions about your:  Alcohol use.  Tobacco use.  Drug use.  Emotional well-being.  Home and relationship well-being.  Sexual activity.  Eating habits.  History of falls.  Memory and ability to understand (cognition).  Work and work Statistician. Screening  You may have the following tests or measurements:  Height, weight, and BMI.  Blood pressure.  Lipid and cholesterol levels. These may be checked every 5 years, or more frequently if you are over 30 years old.  Skin check.  Lung cancer screening. You may have this screening every year starting at age 73 if you have a 30-pack-year history of smoking and currently smoke or have quit within the past 15 years.  Fecal occult blood test (FOBT) of the stool. You may have this test every year starting at age 47.  Flexible sigmoidoscopy or colonoscopy. You may have a sigmoidoscopy every 5 years or a colonoscopy every 10 years starting at age 47.  Prostate cancer screening. Recommendations will vary depending on your family history and other risks.  Hepatitis C blood test.  Hepatitis B blood test.  Sexually transmitted disease (STD) testing.  Diabetes screening. This is done by checking your blood sugar (glucose) after you have not eaten for a while (fasting). You may have this done every 1-3 years.  Abdominal aortic aneurysm (AAA) screening. You may need this if you are a current or former smoker.  Osteoporosis. You may be screened starting at age 6 if you are at high risk. Talk with your health care provider about your test results, treatment options, and if necessary, the need  for more tests. Vaccines  Your health care provider may recommend certain vaccines, such as:  Influenza vaccine. This is recommended every year.  Tetanus, diphtheria, and acellular pertussis (Tdap, Td) vaccine. You may need a Td booster every 10 years.  Zoster vaccine. You may  need this after age 56.  Pneumococcal 13-valent conjugate (PCV13) vaccine. One dose is recommended after age 61.  Pneumococcal polysaccharide (PPSV23) vaccine. One dose is recommended after age 67. Talk to your health care provider about which screenings and vaccines you need and how often you need them. This information is not intended to replace advice given to you by your health care provider. Make sure you discuss any questions you have with your health care provider. Document Released: 10/13/2015 Document Revised: 06/05/2016 Document Reviewed: 07/18/2015 Elsevier Interactive Patient Education  2017 Media Prevention in the Home Falls can cause injuries. They can happen to people of all ages. There are many things you can do to make your home safe and to help prevent falls. What can I do on the outside of my home?  Regularly fix the edges of walkways and driveways and fix any cracks.  Remove anything that might make you trip as you walk through a door, such as a raised step or threshold.  Trim any bushes or trees on the path to your home.  Use bright outdoor lighting.  Clear any walking paths of anything that might make someone trip, such as rocks or tools.  Regularly check to see if handrails are loose or broken. Make sure that both sides of any steps have handrails.  Any raised decks and porches should have guardrails on the edges.  Have any leaves, snow, or ice cleared regularly.  Use sand or salt on walking paths during winter.  Clean up any spills in your garage right away. This includes oil or grease spills. What can I do in the bathroom?  Use night lights.  Install grab bars by the toilet and in the tub and shower. Do not use towel bars as grab bars.  Use non-skid mats or decals in the tub or shower.  If you need to sit down in the shower, use a plastic, non-slip stool.  Keep the floor dry. Clean up any water that spills on the floor as soon as it  happens.  Remove soap buildup in the tub or shower regularly.  Attach bath mats securely with double-sided non-slip rug tape.  Do not have throw rugs and other things on the floor that can make you trip. What can I do in the bedroom?  Use night lights.  Make sure that you have a light by your bed that is easy to reach.  Do not use any sheets or blankets that are too big for your bed. They should not hang down onto the floor.  Have a firm chair that has side arms. You can use this for support while you get dressed.  Do not have throw rugs and other things on the floor that can make you trip. What can I do in the kitchen?  Clean up any spills right away.  Avoid walking on wet floors.  Keep items that you use a lot in easy-to-reach places.  If you need to reach something above you, use a strong step stool that has a grab bar.  Keep electrical cords out of the way.  Do not use floor polish or wax that makes floors slippery. If you must use wax, use  non-skid floor wax.  Do not have throw rugs and other things on the floor that can make you trip. What can I do with my stairs?  Do not leave any items on the stairs.  Make sure that there are handrails on both sides of the stairs and use them. Fix handrails that are broken or loose. Make sure that handrails are as long as the stairways.  Check any carpeting to make sure that it is firmly attached to the stairs. Fix any carpet that is loose or worn.  Avoid having throw rugs at the top or bottom of the stairs. If you do have throw rugs, attach them to the floor with carpet tape.  Make sure that you have a light switch at the top of the stairs and the bottom of the stairs. If you do not have them, ask someone to add them for you. What else can I do to help prevent falls?  Wear shoes that:  Do not have high heels.  Have rubber bottoms.  Are comfortable and fit you well.  Are closed at the toe. Do not wear sandals.  If you  use a stepladder:  Make sure that it is fully opened. Do not climb a closed stepladder.  Make sure that both sides of the stepladder are locked into place.  Ask someone to hold it for you, if possible.  Clearly mark and make sure that you can see:  Any grab bars or handrails.  First and last steps.  Where the edge of each step is.  Use tools that help you move around (mobility aids) if they are needed. These include:  Canes.  Walkers.  Scooters.  Crutches.  Turn on the lights when you go into a dark area. Replace any light bulbs as soon as they burn out.  Set up your furniture so you have a clear path. Avoid moving your furniture around.  If any of your floors are uneven, fix them.  If there are any pets around you, be aware of where they are.  Review your medicines with your doctor. Some medicines can make you feel dizzy. This can increase your chance of falling. Ask your doctor what other things that you can do to help prevent falls. This information is not intended to replace advice given to you by your health care provider. Make sure you discuss any questions you have with your health care provider. Document Released: 07/13/2009 Document Revised: 02/22/2016 Document Reviewed: 10/21/2014 Elsevier Interactive Patient Education  2017 [employer].

## 2018-11-20 NOTE — Progress Notes (Signed)
Subjective:   Ryan Bentley is a 85 y.o. male who presents for Medicare Annual/Subsequent preventive examination.  Review of Systems:  N/A  Cardiac Risk Factors include: advanced age (>41men, >67 women);hypertension;male gender;obesity (BMI >30kg/m2);sedentary lifestyle     Objective:    Vitals: BP (!) 146/64 (BP Location: Right Arm)   Pulse 71   Temp 97.7 F (36.5 C) (Oral)   Ht 5\' 10"  (1.778 m)   Wt 249 lb 9.6 oz (113.2 kg)   BMI 35.81 kg/m   Body mass index is 35.81 kg/m.  Advanced Directives 11/20/2018 11/13/2017 10/22/2016 05/08/2015 03/24/2015 03/23/2015  Does Patient Have a Medical Advance Directive? Yes Yes Yes No;Yes Yes Yes  Type of Paramedic of [place];Living will [place];Living will Living will;Healthcare Power of Attorney Living will [place];Living will [place];Living will  Does patient want to make changes to medical advance directive? - - - - No - Patient declined -  Copy of [place] in Chart? No - copy requested No - copy requested Yes - No - copy requested -  Would patient like information on creating a medical advance directive? - - - No - patient declined information - -    Tobacco Social History   Tobacco Use  Smoking Status Former Smoker  . Types: Cigarettes  . Last attempt to quit: 09/30/1966  . Years since quitting: 52.1  Smokeless Tobacco Never Used     Counseling given: Not Answered   Clinical Intake:  Pre-visit preparation completed: Yes  Pain : No/denies pain Pain Score: 0-No pain     Nutritional Status: BMI > 30  Obese Nutritional Risks: None Diabetes: No  How often do you need to have someone help you when you read instructions, pamphlets, or other written materials from your doctor or pharmacy?: 1 - Never  Interpreter Needed?: No  Information entered by :: [facility], LPN  Past Medical History:  Diagnosis Date  .  Arthritis   . Asthma   . C. difficile colitis 08/23/2016  . History of colon polyps    All benign per patient report  . History of echocardiogram    a. 03/2015: a. EF 55-60%, no WMA, nl diastolic fxn, trivial TR, nl PASP  . Hypertension   . Macular degeneration   . Migraine headache    Past Surgical History:  Procedure Laterality Date  . APPENDECTOMY    . CATARACT EXTRACTION     x2  . DENTAL SURGERY  10/2010  . TONSILLECTOMY AND ADENOIDECTOMY  1940's   Family History  Problem Relation Age of Onset  . Heart disease Mother    Social History   Socioeconomic History  . Marital status: Widowed    Spouse name: Not on file  . Number of children: 2  . Years of education: Not on file  . Highest education level: Bachelor's degree (e.g., BA, AB, BS)  Occupational History  . Occupation: retired  Scientific laboratory technician  . Financial resource strain: Not hard at all  . Food insecurity:    Worry: Never true    Inability: Never true  . Transportation needs:    Medical: No    Non-medical: No  Tobacco Use  . Smoking status: Former Smoker    Types: Cigarettes    Last attempt to quit: 09/30/1966    Years since quitting: 52.1  . Smokeless tobacco: Never Used  Substance and Sexual Activity  . Alcohol use: Yes  Alcohol/week: 0.0 - 14.0 standard drinks    Comment: alternates between 1 glass of wine or 1 bourbon a night  . Drug use: No  . Sexual activity: Never  Lifestyle  . Physical activity:    Days per week: 0 days    Minutes per session: 0 min  . Stress: Not at all  Relationships  . Social connections:    Talks on phone: Patient refused    Gets together: Patient refused    Attends religious service: Patient refused    Active member of club or organization: Patient refused    Attends meetings of clubs or organizations: Patient refused    Relationship status: Patient refused  Other Topics Concern  . Not on file  Social History Narrative  . Not on file    Outpatient Encounter  Medications as of 11/20/2018  Medication Sig  . albuterol (PROVENTIL HFA;VENTOLIN HFA) 108 (90 Base) MCG/ACT inhaler Inhale 2 puffs into the lungs every 6 (six) hours as needed for wheezing or shortness of breath.  Marland Kitchen amLODipine (NORVASC) 10 MG tablet Take 1 tablet (10 mg total) by mouth daily.  Marland Kitchen azithromycin (ZITHROMAX) 250 MG tablet TK 1 T PO D  . clotrimazole-betamethasone (LOTRISONE) cream Apply 1 application topically 2 (two) times daily. (Patient taking differently: Apply 1 application topically as needed. )  . Fluticasone-Salmeterol (ADVAIR) 250-50 MCG/DOSE AEPB Inhale 1 puff into the lungs 2 (two) times daily.  . Multiple Vitamins-Minerals (PRESERVISION AREDS 2) CAPS Take 2 capsules by mouth daily.  . MULTIPLE VITAMINS-MINERALS PO Take by mouth. Reported on 12/06/2015  . predniSONE (DELTASONE) 20 MG tablet Take 20 mg by mouth. Taper dose  . Spacer/Aero Chamber Mouthpiece MISC 1 Units by Does not apply route every 4 (four) hours as needed (wheezing).  . tamsulosin (FLOMAX) 0.4 MG CAPS capsule Take 1 capsule (0.4 mg total) by mouth as needed.   No facility-administered encounter medications on file as of 11/20/2018.     Activities of Daily Living In your present state of health, do you have any difficulty performing the following activities: 11/20/2018  Hearing? N  Vision? Y  Comment Due to MD, wears eye glasses.   Difficulty concentrating or making decisions? N  Walking or climbing stairs? Y  Comment Due to SOB.  Dressing or bathing? N  Doing errands, shopping? N  Preparing Food and eating ? N  Using the Toilet? N  In the past six months, have you accidently leaked urine? N  Do you have problems with loss of bowel control? N  Managing your Medications? N  Managing your Finances? N  Housekeeping or managing your Housekeeping? N  Some recent data might be hidden    Patient Care Team: Birdie Sons, MD as PCP - General (Family Medicine) Estill Cotta, MD as Consulting  Physician (Ophthalmology) Isaias Sakai, MD as Referring Physician (Ophthalmology)   Assessment:   This is a routine wellness examination for [name].  Exercise Activities and Dietary recommendations Current Exercise Habits: The patient does not participate in regular exercise at present, Exercise limited by: None identified  Goals    . DIET - INCREASE WATER INTAKE     Recommend increasing water intake to 4-6 glasses a day.     Marland Kitchen DIET - REDUCE CALORIE INTAKE     Recommend to monitor junk food intake and to limit the amount of salt and sweets in daily diet to help aid in weight loss.     . Increase water intake  Starting 10/22/16, I will continue to drink 6-8 glasses of water a day.       Fall Risk Fall Risk  11/20/2018 11/13/2017 10/22/2016 12/06/2015  Falls in the past year? 0 No No No   FALL RISK PREVENTION PERTAINING TO THE HOME: Any stairs in or around the home? Yes  If so, do they handrails? Yes   Home free of loose throw rugs in walkways, pet beds, electrical cords, etc? Yes  Adequate lighting in your home to reduce risk of falls? Yes   ASSISTIVE DEVICES UTILIZED TO PREVENT FALLS:  Life alert? Yes  Use of a cane, walker or w/c? Yes  Grab bars in the bathroom? No  Shower chair or bench in shower? No  Elevated toilet seat or a handicapped toilet? No    TIMED UP AND GO:  Was the test performed? No .    Depression Screen PHQ 2/9 Scores 11/20/2018 11/13/2017 10/22/2016 12/06/2015  PHQ - 2 Score 0 0 1 0    Cognitive Function     6CIT Screen 11/20/2018 10/22/2016  What Year? 0 points 0 points  What month? 0 points 0 points  What time? 0 points 0 points  Count back from 20 0 points 0 points  Months in reverse 0 points 0 points  Repeat phrase 0 points 0 points  Total Score 0 0    Immunization History  Administered Date(s) Administered  . Influenza, High Dose Seasonal PF 08/04/2017, 07/30/2018  . Influenza-Unspecified 08/01/2015, 07/26/2016  . Pneumococcal  Conjugate-13 11/30/2014  . Pneumococcal Polysaccharide-23 08/22/2008  . Zoster 12/06/2015    Qualifies for Shingles Vaccine? Yes  Zostavax completed 12/06/15. Due for Shingrix. Education has been provided regarding the importance of this vaccine. Pt has been advised to call insurance company to determine out of pocket expense. Advised may also receive vaccine at local pharmacy or Health Dept. Verbalized acceptance and understanding.  Tdap: Up to date  Flu Vaccine: Up to date  Pneumococcal Vaccine: Up to date   Screening Tests Health Maintenance  Topic Date Due  . TETANUS/TDAP  04/27/2023  . INFLUENZA VACCINE  Completed  . PNA vac Low Risk Adult  Completed   Cancer Screenings:  Colorectal Screening: No longer required.   Lung Cancer Screening: (Low Dose CT Chest recommended if Age 25-80 years, 30 pack-year currently smoking OR have quit w/in 15years.) does not qualify.    Additional Screening:  Vision Screening: Recommended annual ophthalmology exams for early detection of glaucoma and other disorders of the eye.  Dental Screening: Recommended annual dental exams for proper oral hygiene  Community Resource Referral:  CRR required this visit?  No       Plan:  I have personally reviewed and addressed the Medicare Annual Wellness questionnaire and have noted the following in the patient's chart:  A. Medical and social history B. Use of alcohol, tobacco or illicit drugs  C. Current medications and supplements D. Functional ability and status E.  Nutritional status F.  Physical activity G. Advance directives H. List of other physicians I.  Hospitalizations, surgeries, and ER visits in previous 12 months [place] such as hearing and vision if needed, cognitive and depression L. Referrals and appointments - none  In addition, I have reviewed and discussed with patient certain preventive protocols, quality metrics, and best practice recommendations. A  written personalized care plan for preventive services as well as general preventive health recommendations were provided to patient.  See attached scanned questionnaire for additional  information.   Signed,  Fabio Neighbors, LPN Nurse Health Advisor   Nurse Recommendations: None.

## 2018-11-23 DIAGNOSIS — H353221 Exudative age-related macular degeneration, left eye, with active choroidal neovascularization: Secondary | ICD-10-CM | POA: Diagnosis not present

## 2018-11-23 DIAGNOSIS — H353112 Nonexudative age-related macular degeneration, right eye, intermediate dry stage: Secondary | ICD-10-CM | POA: Diagnosis not present

## 2018-12-15 ENCOUNTER — Ambulatory Visit: Payer: Self-pay | Admitting: Family Medicine

## 2018-12-16 ENCOUNTER — Encounter: Payer: Self-pay | Admitting: Family Medicine

## 2018-12-16 ENCOUNTER — Ambulatory Visit (INDEPENDENT_AMBULATORY_CARE_PROVIDER_SITE_OTHER): Payer: Medicare Other | Admitting: Family Medicine

## 2018-12-16 ENCOUNTER — Other Ambulatory Visit: Payer: Self-pay

## 2018-12-16 VITALS — BP 148/66 | HR 60 | Temp 97.7°F | Resp 16 | Ht 70.0 in | Wt 247.0 lb

## 2018-12-16 DIAGNOSIS — I10 Essential (primary) hypertension: Secondary | ICD-10-CM | POA: Diagnosis not present

## 2018-12-16 DIAGNOSIS — Z6835 Body mass index (BMI) 35.0-35.9, adult: Secondary | ICD-10-CM | POA: Diagnosis not present

## 2018-12-16 MED ORDER — HYDROCHLOROTHIAZIDE 25 MG PO TABS
25.0000 mg | ORAL_TABLET | Freq: Every day | ORAL | 1 refills | Status: DC
Start: 2018-12-16 — End: 2019-06-02

## 2018-12-16 MED ORDER — AMLODIPINE BESYLATE 5 MG PO TABS: 5.0000 mg | ORAL_TABLET | Freq: Every day | ORAL | 1 refills | Status: DC

## 2018-12-16 NOTE — Progress Notes (Signed)
Patient: Ryan Bentley Male    DOB: [birth year]   85 y.o.   MRN: [id number] Visit Date: 12/16/2018  Today's Provider: Lelon Huh, MD   Chief Complaint  Patient presents with  . Hypertension   Subjective:     HPI    Hypertension, follow-up:  BP Readings from Last 3 Encounters:  12/16/18 (!) 148/66  11/20/18 (!) 146/64  11/06/18 (!) 144/78    He was last seen for hypertension 3 months ago.  BP at that visit was 144/77. Management since that visit includes increasing Amlodipine to 10mg  a day. He reports excellent compliance with treatment. He is not having side effects.  He is not exercising. He is adherent to low salt diet.   Outside blood pressures are not being checked. He is experiencing lower extremity edema and Some shortness of breath.  Pt is still recoving from the Flu, but reports feeling better. .  Patient denies chest pain, palpitations and syncope.   Cardiovascular risk factors include advanced age (older than 68 for men, 66 for women), hypertension, male gender and obesity (BMI >= 30 kg/m2).  Use of agents associated with hypertension: none.     Weight trend: stable Wt Readings from Last 3 Encounters:  12/16/18 247 lb (112 kg)  11/20/18 249 lb 9.6 oz (113.2 kg)  11/06/18 240 lb (108.9 kg)    Current diet: in general, a "healthy" diet     He also reports he was briefly hospitalized recently in [place] for flu A and has recovered completely. He was noted to have some swelling had states he had echocardiogram done which was normal.  ------------------------------------------------------------------------    Allergies  Allergen Reactions  . Iodine Itching  . Ivp Dye [Iodinated Diagnostic Agents] Anxiety    Agitation     Current Outpatient Medications:  .  albuterol (PROVENTIL HFA;VENTOLIN HFA) 108 (90 Base) MCG/ACT inhaler, Inhale 2 puffs into the lungs every 6 (six) hours as needed for wheezing or shortness of breath., Disp:  1 Inhaler, Rfl: 2 .  amLODipine (NORVASC) 10 MG tablet, Take 1 tablet (10 mg total) by mouth daily., Disp: 90 tablet, Rfl: 3 .  clotrimazole-betamethasone (LOTRISONE) cream, Apply 1 application topically 2 (two) times daily. (Patient taking differently: Apply 1 application topically as needed. ), Disp: 15 g, Rfl: 3 .  Fluticasone-Salmeterol (ADVAIR) 250-50 MCG/DOSE AEPB, Inhale 1 puff into the lungs 2 (two) times daily., Disp: 3 each, Rfl: 4 .  Multiple Vitamins-Minerals (PRESERVISION AREDS 2) CAPS, Take 2 capsules by mouth daily., Disp: , Rfl:  .  MULTIPLE VITAMINS-MINERALS PO, Take by mouth. Reported on 12/06/2015, Disp: , Rfl:  .  Spacer/Aero Chamber Mouthpiece MISC, 1 Units by Does not apply route every 4 (four) hours as needed (wheezing)., Disp: 1 each, Rfl: 0 .  tamsulosin (FLOMAX) 0.4 MG CAPS capsule, Take 1 capsule (0.4 mg total) by mouth as needed., Disp: 30 capsule, Rfl: 5 .  azithromycin (ZITHROMAX) 250 MG tablet, TK 1 T PO D, Disp: , Rfl:  .  predniSONE (DELTASONE) 20 MG tablet, Take 20 mg by mouth. Taper dose, Disp: , Rfl:   Review of Systems  Constitutional: Negative for appetite change, chills and fever.  Respiratory: Negative for chest tightness, shortness of breath and wheezing.   Cardiovascular: Negative for chest pain and palpitations.  Gastrointestinal: Negative for abdominal pain, nausea and vomiting.    Social History   Tobacco Use  . Smoking status: Former Smoker    Types: Cigarettes  Last attempt to quit: 09/30/1966    Years since quitting: 52.2  . Smokeless tobacco: Never Used  Substance Use Topics  . Alcohol use: Yes    Alcohol/week: 0.0 - 14.0 standard drinks    Comment: alternates between 1 glass of wine or 1 bourbon a night      Objective:   BP (!) 148/66 (BP Location: Right Arm, Patient Position: Sitting, Cuff Size: Large)   Pulse 60   Temp 97.7 F (36.5 C) (Oral)   Resp 16   Ht 5\' 10"  (1.778 m)   Wt 247 lb (112 kg)   BMI 35.44 kg/m  Vitals:    12/16/18 1007  BP: (!) 148/66  Pulse: 60  Resp: 16  Temp: 97.7 F (36.5 C)  TempSrc: Oral  Weight: 247 lb (112 kg)  Height: 5\' 10"  (1.778 m)     Physical Exam   General Appearance:    Alert, cooperative, no distress  Eyes:    PERRL, conjunctiva/corneas clear, EOM's intact       Lungs:     Clear to auscultation bilaterally, respirations unlabored  Heart:    Regular rate and rhythm  Neurologic:   Awake, alert, oriented x 3. No apparent focal neurological           defect.           Assessment & Plan    1. Essential hypertension Minimal if any improvement with increased dose of amlodipine with increased edema. Will reduce dose back to 5mg  and start hctz 25 mg daily recheck in a month.   2. BMI 35.0-35.9,adult Encouraged increased exercise to lose weight.      Lelon Huh, MD  [facility]

## 2018-12-16 NOTE — Patient Instructions (Signed)
.   Please review the attached list of medications and notify my office if there are any errors.   . Please bring all of your medications to every appointment so we can make sure that our medication list is the same as yours.    The CDC recommends two doses of Shingrix (the shingles vaccine) separated by 2 to 6 months for adults age 83 years and older. I recommend checking with your insurance plan regarding coverage for this vaccine.   

## 2019-01-08 ENCOUNTER — Other Ambulatory Visit: Payer: Self-pay

## 2019-01-08 ENCOUNTER — Emergency Department: Payer: Medicare Other

## 2019-01-08 ENCOUNTER — Emergency Department
Admission: EM | Admit: 2019-01-08 | Discharge: 2019-01-08 | Disposition: A | Discharge status: Home / Self Care | Payer: Medicare Other | Attending: Emergency Medicine | Admitting: Emergency Medicine

## 2019-01-08 DIAGNOSIS — W01198A Fall on same level from slipping, tripping and stumbling with subsequent striking against other object, initial encounter: Secondary | ICD-10-CM | POA: Diagnosis not present

## 2019-01-08 DIAGNOSIS — S82142A Displaced bicondylar fracture of left tibia, initial encounter for closed fracture: Secondary | ICD-10-CM

## 2019-01-08 DIAGNOSIS — S80919A Unspecified superficial injury of unspecified knee, initial encounter: Secondary | ICD-10-CM | POA: Diagnosis not present

## 2019-01-08 DIAGNOSIS — Z79899 Other long term (current) drug therapy: Secondary | ICD-10-CM | POA: Diagnosis not present

## 2019-01-08 DIAGNOSIS — S8992XA Unspecified injury of left lower leg, initial encounter: Secondary | ICD-10-CM | POA: Insufficient documentation

## 2019-01-08 DIAGNOSIS — I10 Essential (primary) hypertension: Secondary | ICD-10-CM | POA: Diagnosis not present

## 2019-01-08 DIAGNOSIS — M25562 Pain in left knee: Secondary | ICD-10-CM | POA: Diagnosis not present

## 2019-01-08 DIAGNOSIS — R52 Pain, unspecified: Secondary | ICD-10-CM | POA: Diagnosis not present

## 2019-01-08 DIAGNOSIS — J449 Chronic obstructive pulmonary disease, unspecified: Secondary | ICD-10-CM | POA: Insufficient documentation

## 2019-01-08 DIAGNOSIS — Y999 Unspecified external cause status: Secondary | ICD-10-CM | POA: Insufficient documentation

## 2019-01-08 DIAGNOSIS — S0990XA Unspecified injury of head, initial encounter: Secondary | ICD-10-CM | POA: Insufficient documentation

## 2019-01-08 DIAGNOSIS — Z87891 Personal history of nicotine dependence: Secondary | ICD-10-CM | POA: Diagnosis not present

## 2019-01-08 DIAGNOSIS — R58 Hemorrhage, not elsewhere classified: Secondary | ICD-10-CM | POA: Diagnosis not present

## 2019-01-08 DIAGNOSIS — Y92008 Other place in unspecified non-institutional (private) residence as the place of occurrence of the external cause: Secondary | ICD-10-CM | POA: Diagnosis not present

## 2019-01-08 DIAGNOSIS — Y939 Activity, unspecified: Secondary | ICD-10-CM | POA: Diagnosis not present

## 2019-01-08 DIAGNOSIS — M25511 Pain in right shoulder: Secondary | ICD-10-CM | POA: Diagnosis not present

## 2019-01-08 DIAGNOSIS — S72452A Displaced supracondylar fracture without intracondylar extension of lower end of left femur, initial encounter for closed fracture: Secondary | ICD-10-CM | POA: Diagnosis not present

## 2019-01-08 DIAGNOSIS — S82122A Displaced fracture of lateral condyle of left tibia, initial encounter for closed fracture: Secondary | ICD-10-CM | POA: Insufficient documentation

## 2019-01-08 MED ORDER — OXYCODONE-ACETAMINOPHEN 5-325 MG PO TABS: 1.0000 | ORAL_TABLET | ORAL | 0 refills | Status: DC | PRN

## 2019-01-08 MED ORDER — OXYCODONE-ACETAMINOPHEN 5-325 MG PO TABS
2.0000 | ORAL_TABLET | Freq: Once | ORAL | Status: AC
  Administered 2019-01-08: 2 via ORAL
  Filled 2019-01-08: qty 2

## 2019-01-08 NOTE — Discharge Instructions (Signed)
You have been seen in the emergency department for a fall.  It appears that you have suffered a tibial plateau fracture.  Please use your knee immobilizer during the daytime, use a walker to ambulate.  He may take pain medication as needed, but only as prescribed.  Please follow-up with Dr. Rudene Christians of orthopedics this week for recheck/reevaluation.  Call Monday to arrange an appointment.  Return to the emergency department for any worsening pain, inability to ambulate, or any other symptom personally concerning to yourself.

## 2019-01-08 NOTE — ED Provider Notes (Signed)
[facility] Emergency Department Provider Note  Time seen: 5:18 PM  I have reviewed the triage vital signs and the nursing notes.   HISTORY  Chief Complaint Shoulder Pain and Knee Pain    HPI Ryan Bentley is a 85 y.o. male with a past medical history of arthritis, asthma, hypertension, presents to the emergency department from home after a fall.  According to the patient he tripped on 1/2 inch ledge where the concrete meets his brick pathway.  Patient states he attempted to catch himself but landed on his right arm is now having moderate pain in the right shoulder much worse with any attempted movement, moderate pain in the left knee.  Patient did hit his head although denies LOC.  Patient has a hematoma to left forehead.  Denies neck or back pain, denies chest or abdominal pain.  Right shoulder pain is dull, somewhat sharp with attempted movement.     Past Medical History:  Diagnosis Date  . Arthritis   . Asthma   . C. difficile colitis 08/23/2016  . History of colon polyps    All benign per patient report  . History of echocardiogram    a. 03/2015: a. EF 55-60%, no WMA, nl diastolic fxn, trivial TR, nl PASP  . Hypertension   . Macular degeneration   . Migraine headache     Patient Active Problem List   Diagnosis Date Noted  . Mobitz type 1 second degree AV block 09/14/2018  . Macular degeneration 07/08/2017  . Bilateral low back pain without sciatica 07/08/2017  . Actinic keratosis 03/29/2015  . Liver cyst 03/29/2015  . Thyroid nodule 03/29/2015  . BPH (benign prostatic hyperplasia) 03/28/2015  . COPD (chronic obstructive pulmonary disease) ([place]) 03/28/2015  . History of colonic polyps 03/28/2015  . Migraine 03/28/2015  . BMI 35.0-35.9,adult 03/28/2015  . Essential hypertension 03/24/2015  . Obesity 03/23/2015  . Seborrhea 06/15/2007  . 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 GERD (gastroesophageal reflux disease)  09/30/1998    Past Surgical History:  Procedure Laterality Date  . APPENDECTOMY    . CATARACT EXTRACTION     x2  . DENTAL SURGERY  10/2010  . TONSILLECTOMY AND ADENOIDECTOMY  1940's    Prior to Admission medications   Medication Sig Start Date End Date Taking? Authorizing Provider  albuterol (PROVENTIL HFA;VENTOLIN HFA) 108 (90 Base) MCG/ACT inhaler Inhale 2 puffs into the lungs every 6 (six) hours as needed for wheezing or shortness of breath. 10/20/18   Trinna Post, PA-C  amLODipine (NORVASC) 5 MG tablet Take 1 tablet (5 mg total) by mouth daily. 12/16/18   Birdie Sons, MD  Fluticasone-Salmeterol (ADVAIR) 250-50 MCG/DOSE AEPB Inhale 1 puff into the lungs 2 (two) times daily. 05/01/18   Birdie Sons, MD  hydrochlorothiazide (HYDRODIURIL) 25 MG tablet Take 1 tablet (25 mg total) by mouth daily. 12/16/18   Birdie Sons, MD  Multiple Vitamins-Minerals (PRESERVISION AREDS 2) CAPS Take 2 capsules by mouth daily.    [provider]  MULTIPLE VITAMINS-MINERALS PO Take by mouth. Reported on 12/06/2015    [provider]  Spacer/Aero Chamber Mouthpiece MISC 1 Units by Does not apply route every 4 (four) hours as needed (wheezing). 01/19/18   Darel Hong, MD  tamsulosin (FLOMAX) 0.4 MG CAPS capsule Take 1 capsule (0.4 mg total) by mouth as needed. 03/13/18   Birdie Sons, MD    Allergies  Allergen Reactions  . Iodine Itching  .  Ivp Dye [Iodinated Diagnostic Agents] Anxiety    Agitation    Family History  Problem Relation Age of Onset  . Heart disease Mother     Social History Social History   Tobacco Use  . Smoking status: Former Smoker    Types: Cigarettes    Last attempt to quit: 09/30/1966    Years since quitting: 52.3  . Smokeless tobacco: Never Used  Substance Use Topics  . Alcohol use: Yes    Alcohol/week: 0.0 - 14.0 standard drinks    Comment: alternates between 1 glass of wine or 1 bourbon a night  . Drug use: No    Review of  Systems Constitutional: Negative for loss of consciousness ENT: Injury to left forehead/eyebrow. Cardiovascular: Negative for chest pain. Gastrointestinal: Negative for abdominal pain Musculoskeletal: Positive right shoulder pain, left knee pain Skin: Abrasion to left eyebrow Neurological: Negative for headache All other ROS negative  ____________________________________________   PHYSICAL EXAM:  VITAL SIGNS: ED Triage Vitals  Enc Vitals Group     BP 01/08/19 1716 (!) 158/83     Pulse Rate 01/08/19 1707 65     Resp 01/08/19 1707 17     Temp 01/08/19 1707 98.6 F (37 C)     Temp Source 01/08/19 1707 Oral     SpO2 01/08/19 1707 100 %     Weight 01/08/19 1712 244 lb (110.7 kg)     Height 01/08/19 1712 5\' 10"  (1.778 m)     Head Circumference --      Peak Flow --      Pain Score 01/08/19 1711 7     Pain Loc --      Pain Edu? --      Excl. in [place]? --    Constitutional: Alert and oriented. Well appearing and in no distress. Eyes: Normal exam ENT      Head: Moderate hematoma to left eyebrow, abrasion to this area as well, no laceration.  Hemostatic.      Mouth/Throat: Mucous membranes are moist. Cardiovascular: Normal rate, regular rhythm. Respiratory: Normal respiratory effort without tachypnea nor retractions. Breath sounds are clear  Gastrointestinal: Soft and nontender. No distention.  Musculoskeletal: Moderate tenderness palpation of the right shoulder/proximal humerus with pain with attempted range of motion.  Neurovascular intact distally.  Mild tenderness to the left knee, decent range of motion with mild pain elicited.  Neurovascular intact distally.  All other extremities have good range of motion and are nontender.   Neurologic:  Normal speech and language. No gross focal neurologic deficits Skin:  Skin is warm, dry and intact.  Psychiatric: Mood and affect are normal.  ____________________________________________    EKG  EKG viewed and interpreted by myself  shows a normal sinus rhythm at 64 bpm with a narrow QRS, normal axis, normal intervals, no concerning ST changes.  ____________________________________________    RADIOLOGY  X-ray shows nondisplaced supracondylar fracture.  IMPRESSION: Lateral tibial plateau fracture with 3 mm articular disruption (Schatzker type 1 fracture). Moderate lipohemarthrosis.  Cortical irregularity of the femoral condyles on radiograph was likely osseous overlap, no evidence of femoral condylar fracture.  ____________________________________________   INITIAL IMPRESSION / ASSESSMENT AND PLAN / ED COURSE  Pertinent labs & imaging results that were available during my care of the patient were reviewed by me and considered in my medical decision making (see chart for details).   Patient presents to the emergency department after a fall and head injury.  Denies LOC.  Patient does have a hematoma to  left forehead, has right shoulder pain and left knee pain.  Will obtain x-rays of the left knee, right shoulder, CT scan of the head and C-spine as a precaution.  Differential would include intracranial bleed, fracture, dislocations, contusions.  X-ray shows a supracondylar fracture of the femur, I discussed the patient with Dr. Rudene Christians of orthopedics, he recommends obtaining CT imaging for further evaluation.  Patient CT has resulted showing cortical irregularity on the x-ray is not a fracture, however the patient does have a lateral tibial plateau fracture.  I discussed this once again with Dr. Rudene Christians he states this is nonsurgical, place the patient in a knee immobilizer with weightbearing as tolerated minimal weightbearing if possible.  I offered admission to the hospital for placement to rehab, patient strongly wants to go home.  Patient was able to ambulate in the emergency department with use of a walker, he feels safe going home and wishes to do so.  We will discharge from the emergency department with pain medication  and orthopedic follow-up.  ____________________________________________   FINAL CLINICAL IMPRESSION(S) / ED DIAGNOSES  Fall Tibial plateau fracture   Harvest Dark, MD 01/08/19 2047

## 2019-01-08 NOTE — ED Triage Notes (Signed)
Mechanical fall c/o of pain to right shoulder and left knee. Patient ambulates with cane. Denies LOC. Does not take any blood thinners.

## 2019-01-08 NOTE — ED Notes (Signed)
Knee immobilzer placed to left knee. Patient ambulated well with walker assist.

## 2019-01-08 NOTE — ED Notes (Signed)
Returned from xray with 9/10 pain. md aware po med given will re-assess.

## 2019-01-08 NOTE — ED Notes (Signed)
Present with hematoma to left eye, no active bleeding, pain the right shoulder, pain to left knee. Road rash noted to left knee with some swelling when compared to right knee. abrasion to left elbow.

## 2019-01-09 ENCOUNTER — Encounter: Payer: Self-pay | Admitting: Emergency Medicine

## 2019-01-09 ENCOUNTER — Emergency Department
Admission: EM | Admit: 2019-01-09 | Discharge: 2019-01-09 | Disposition: A | Discharge status: Skilled Nursing Facility | Payer: Medicare Other | Attending: Emergency Medicine | Admitting: Emergency Medicine

## 2019-01-09 DIAGNOSIS — F0781 Postconcussional syndrome: Secondary | ICD-10-CM | POA: Insufficient documentation

## 2019-01-09 DIAGNOSIS — W19XXXA Unspecified fall, initial encounter: Secondary | ICD-10-CM | POA: Diagnosis not present

## 2019-01-09 DIAGNOSIS — J449 Chronic obstructive pulmonary disease, unspecified: Secondary | ICD-10-CM | POA: Insufficient documentation

## 2019-01-09 DIAGNOSIS — Z022 Encounter for examination for admission to residential institution: Secondary | ICD-10-CM | POA: Diagnosis not present

## 2019-01-09 DIAGNOSIS — I10 Essential (primary) hypertension: Secondary | ICD-10-CM | POA: Insufficient documentation

## 2019-01-09 DIAGNOSIS — Z87891 Personal history of nicotine dependence: Secondary | ICD-10-CM | POA: Diagnosis not present

## 2019-01-09 DIAGNOSIS — Z79899 Other long term (current) drug therapy: Secondary | ICD-10-CM | POA: Insufficient documentation

## 2019-01-09 DIAGNOSIS — J45909 Unspecified asthma, uncomplicated: Secondary | ICD-10-CM | POA: Insufficient documentation

## 2019-01-09 DIAGNOSIS — R110 Nausea: Secondary | ICD-10-CM | POA: Diagnosis not present

## 2019-01-09 MED ORDER — ONDANSETRON 4 MG PO TBDP: 4.0000 mg | ORAL_TABLET | Freq: Three times a day (TID) | ORAL | 0 refills | Status: DC | PRN

## 2019-01-09 MED ORDER — ONDANSETRON 4 MG PO TBDP
4.0000 mg | ORAL_TABLET | Freq: Once | ORAL | Status: AC
  Administered 2019-01-09: 08:00:00 4 mg via ORAL
  Filled 2019-01-09: qty 1

## 2019-01-09 MED ORDER — OXYCODONE-ACETAMINOPHEN 5-325 MG PO TABS: 1.0000 | ORAL_TABLET | ORAL | 0 refills | Status: DC | PRN

## 2019-01-09 NOTE — ED Provider Notes (Signed)
[facility] Emergency Department Provider Note   ____________________________________________    I have reviewed the triage vital signs and the nursing notes.   HISTORY  Chief Complaint Nausea, medical clearance    HPI Ryan Bentley is a 84 y.o. male who presents from [facility] independent living, patient was seen here yesterday diagnosed with tibial plateau injury, shoulder strain, likely concussion from head injury.  Patient reports last night he had some mild nausea, he attributes this to not having eaten most of the day.  He did not take any narcotics.  Currently is feeling better.  Denies any fevers chills or cough.  This morning he had difficulty getting off the toilet because of the pain in his knee and in his right shoulder.  Hence he would like to go into respite care for a period of time.  Does not feel that he needed to be sent back over here.  Not on blood thinners.  No neuro deficits.  No headache  Past Medical History:  Diagnosis Date  . Arthritis   . Asthma   . C. difficile colitis 08/23/2016  . History of colon polyps    All benign per patient report  . History of echocardiogram    a. 03/2015: a. EF 55-60%, no WMA, nl diastolic fxn, trivial TR, nl PASP  . Hypertension   . Macular degeneration   . Migraine headache     Patient Active Problem List   Diagnosis Date Noted  . Mobitz type 1 second degree AV block 09/14/2018  . Macular degeneration 07/08/2017  . Bilateral low back pain without sciatica 07/08/2017  . Actinic keratosis 03/29/2015  . Liver cyst 03/29/2015  . Thyroid nodule 03/29/2015  . BPH (benign prostatic hyperplasia) 03/28/2015  . COPD (chronic obstructive pulmonary disease) ([place]) 03/28/2015  . History of colonic polyps 03/28/2015  . Migraine 03/28/2015  . BMI 35.0-35.9,adult 03/28/2015  . Essential hypertension 03/24/2015  . Obesity 03/23/2015  . Seborrhea 06/15/2007  . Asthma due to internal immunological  process 09/30/1998  . GERD (gastroesophageal reflux disease) 09/30/1998    Past Surgical History:  Procedure Laterality Date  . APPENDECTOMY    . CATARACT EXTRACTION     x2  . DENTAL SURGERY  10/2010  . TONSILLECTOMY AND ADENOIDECTOMY  1940's    Prior to Admission medications   Medication Sig Start Date End Date Taking? Authorizing Provider  albuterol (PROVENTIL HFA;VENTOLIN HFA) 108 (90 Base) MCG/ACT inhaler Inhale 2 puffs into the lungs every 6 (six) hours as needed for wheezing or shortness of breath. 10/20/18   Trinna Post, PA-C  amLODipine (NORVASC) 5 MG tablet Take 1 tablet (5 mg total) by mouth daily. 12/16/18   Birdie Sons, MD  Fluticasone-Salmeterol (ADVAIR) 250-50 MCG/DOSE AEPB Inhale 1 puff into the lungs 2 (two) times daily. 05/01/18   Birdie Sons, MD  hydrochlorothiazide (HYDRODIURIL) 25 MG tablet Take 1 tablet (25 mg total) by mouth daily. 12/16/18   Birdie Sons, MD  Multiple Vitamins-Minerals (PRESERVISION AREDS 2) CAPS Take 2 capsules by mouth daily.    [provider]  MULTIPLE VITAMINS-MINERALS PO Take by mouth. Reported on 12/06/2015    [provider]  ondansetron (ZOFRAN ODT) 4 MG disintegrating tablet Take 1 tablet (4 mg total) by mouth every 8 (eight) hours as needed. 01/09/19   Lavonia Drafts, MD  oxyCODONE-acetaminophen (PERCOCET) 5-325 MG tablet Take 1 tablet by mouth every 4 (four) hours as needed for severe pain. 01/08/19  Harvest Dark, MD  Spacer/Aero Chamber Mouthpiece MISC 1 Units by Does not apply route every 4 (four) hours as needed (wheezing). 01/19/18   Darel Hong, MD  tamsulosin (FLOMAX) 0.4 MG CAPS capsule Take 1 capsule (0.4 mg total) by mouth as needed. 03/13/18   Birdie Sons, MD     Allergies Iodine and Ivp dye [iodinated diagnostic agents]  Family History  Problem Relation Age of Onset  . Heart disease Mother     Social History Social History   Tobacco Use  . Smoking status: Former Smoker     Types: Cigarettes    Last attempt to quit: 09/30/1966    Years since quitting: 52.3  . Smokeless tobacco: Never Used  Substance Use Topics  . Alcohol use: Yes    Alcohol/week: 0.0 - 14.0 standard drinks    Comment: alternates between 1 glass of wine or 1 bourbon a night  . Drug use: No    Review of Systems  Constitutional: No fever/chills Eyes: No visual changes.  ENT: No neck pain Cardiovascular: Denies chest pain. Respiratory: Denies shortness of breath.  Cough Gastrointestinal: No abdominal pain.  Nausea, no vomiting, now improved Genitourinary: Negative for dysuria. Musculoskeletal: Right shoulder and right knee pain Skin: Negative for rash. Neurological: Negative for headaches or weakness   ____________________________________________   PHYSICAL EXAM:  VITAL SIGNS: ED Triage Vitals  Enc Vitals Group     BP 01/09/19 0712 (!) 167/68     Pulse Rate 01/09/19 0712 65     Resp 01/09/19 0712 (!) 22     Temp 01/09/19 0712 97.8 F (36.6 C)     Temp Source 01/09/19 0712 Oral     SpO2 01/09/19 0712 100 %     Weight --      Height --      Head Circumference --      Peak Flow --      Pain Score 01/09/19 0714 8     Pain Loc --      Pain Edu? --      Excl. in [place]? --     Constitutional: Alert and oriented. No acute distress.   Head: Left orbit hematoma  Neck:  Painless ROM Cardiovascular: Normal rate, regular rhythm. Grossly normal heart sounds.  Good peripheral circulation. Respiratory: Normal respiratory effort.  No retractions. Gastrointestinal: Soft and nontender. No distention.   Musculoskeletal:  Warm and well perfused Neurologic:  Normal speech and language. No gross focal neurologic deficits are appreciated.  Skin:  Skin is warm, dry and intact. Psychiatric: Mood and affect are normal. Speech and behavior are normal.  ____________________________________________   LABS (all labs ordered are listed, but only abnormal results are displayed)  Labs Reviewed  - No data to display ____________________________________________  EKG  None ____________________________________________  RADIOLOGY  None ____________________________________________   PROCEDURES  Procedure(s) performed: No  Procedures   Critical Care performed: No ____________________________________________   INITIAL IMPRESSION / ASSESSMENT AND PLAN / ED COURSE  Pertinent labs & imaging results that were available during my care of the patient were reviewed by me and considered in my medical decision making (see chart for details).  Patient sent for evaluation of very mild nausea which is already improved and also for clearance to be transferred to their respite area.  Patient is extremely well-appearing and essentially asymptomatic at this time.  He has no abdominal pain.  He has no headache or neuro deficits.  He attributes nausea to not eating although I suspect this  is related to postconcussion nausea.  Will treat with p.o. Zofran.  No fevers chills cough shortness of breath.  No indication for coronavirus testing at this time.  Discussed with nurse at [facility], patient appropriate for discharge   Ryan Bentley was evaluated in Emergency Department on 01/09/2019 for the symptoms described in the history of present illness. He was evaluated in the context of the global COVID-19 pandemic, which necessitated consideration that the patient might be at risk for infection with the SARS-CoV-2 virus that causes COVID-19. Institutional protocols and algorithms that pertain to the evaluation of patients at risk for COVID-19 are in a state of rapid change based on information released by regulatory bodies including the CDC and federal and state organizations. These policies and algorithms were followed during the patient's care in the ED.     ____________________________________________   FINAL CLINICAL IMPRESSION(S) / ED DIAGNOSES  Final diagnoses:  Post-concussional  syndrome        Note:  This document was prepared using Dragon voice recognition software and may include unintentional dictation errors.   Lavonia Drafts, MD 01/09/19 [phone]

## 2019-01-09 NOTE — ED Triage Notes (Signed)
Pt to ED from [place] for medical clearance to go to [facility].

## 2019-01-09 NOTE — ED Notes (Signed)
This RN spoke with [place] about pt returning to facility. TL RN informed that [facility] has been [facility]. Medications were sent to [facility] for delivery to facility. RN also informed of discharge instructions from previous day indicating that pt may bear weight on leg.

## 2019-01-09 NOTE — ED Notes (Signed)
Pt verbalized understanding of discharge instructions. NAD at this time. Pt transported by [employer] to [facility].

## 2019-01-13 DIAGNOSIS — S82142A Displaced bicondylar fracture of left tibia, initial encounter for closed fracture: Secondary | ICD-10-CM | POA: Diagnosis not present

## 2019-01-13 DIAGNOSIS — M25562 Pain in left knee: Secondary | ICD-10-CM | POA: Diagnosis not present

## 2019-01-19 ENCOUNTER — Encounter: Payer: Self-pay | Admitting: Family Medicine

## 2019-01-19 ENCOUNTER — Other Ambulatory Visit: Payer: Self-pay

## 2019-01-19 ENCOUNTER — Ambulatory Visit: Payer: Medicare Other | Admitting: Family Medicine

## 2019-01-19 ENCOUNTER — Ambulatory Visit (INDEPENDENT_AMBULATORY_CARE_PROVIDER_SITE_OTHER): Payer: Medicare Other | Admitting: Family Medicine

## 2019-01-19 VITALS — BP 140/70 | HR 61 | Temp 98.7°F | Resp 16 | Wt 229.0 lb

## 2019-01-19 DIAGNOSIS — I10 Essential (primary) hypertension: Secondary | ICD-10-CM | POA: Diagnosis not present

## 2019-01-19 NOTE — Progress Notes (Signed)
Patient: Ryan Bentley Male    DOB: 1932-09-15   85 y.o.   MRN: [id number] Visit Date: 01/19/2019  Today's Provider: Lelon Huh, MD   Chief Complaint  Patient presents with  . Follow-up  . Hypertension   Subjective:     HPI  Hypertension, follow-up:  BP Readings from Last 3 Encounters:  01/19/19 (!) 150/78  01/09/19 (!) 170/76  01/08/19 (!) 154/71    He was last seen for hypertension 1 months ago.  BP at that visit was 148/66. Management since that visit includes reducing the dose of Amlodipine to 5mg  due to edema, and starting HCTZ 25mg  daily. He reports good compliance with treatment. He is not having side effects.  He is not exercising. He is not adherent to low salt diet.   Outside blood pressures are not checked at home. He is experiencing none.  Patient denies chest pain, chest pressure/discomfort, claudication, dyspnea, exertional chest pressure/discomfort, fatigue, irregular heart beat, lower extremity edema, near-syncope, orthopnea, palpitations, paroxysmal nocturnal dyspnea, syncope and tachypnea.   Cardiovascular risk factors include advanced age (older than 72 for men, 72 for women), hypertension and male gender.  Use of agents associated with hypertension: none.    He fractured his tibia last week.  Weight trend: fluctuating a bit Wt Readings from Last 3 Encounters:  01/19/19 229 lb (103.9 kg)  01/08/19 244 lb (110.7 kg)  12/16/18 247 lb (112 kg)    Current diet: regular diet  ------------------------------------------------------------------------ Patient seen in the ER on 01/08/2019 and 01/09/2019 for Tibial Fracture and concussional syndrome.    Allergies  Allergen Reactions  . Iodine Itching  . Ivp Dye [Iodinated Diagnostic Agents] Anxiety    Agitation     Current Outpatient Medications:  .  albuterol (PROVENTIL HFA;VENTOLIN HFA) 108 (90 Base) MCG/ACT inhaler, Inhale 2 puffs into the lungs every 6 (six) hours as needed for  wheezing or shortness of breath., Disp: 1 Inhaler, Rfl: 2 .  amLODipine (NORVASC) 5 MG tablet, Take 1 tablet (5 mg total) by mouth daily., Disp: 90 tablet, Rfl: 1 .  Fluticasone-Salmeterol (ADVAIR) 250-50 MCG/DOSE AEPB, Inhale 1 puff into the lungs 2 (two) times daily., Disp: 3 each, Rfl: 4 .  hydrochlorothiazide (HYDRODIURIL) 25 MG tablet, Take 1 tablet (25 mg total) by mouth daily., Disp: 90 tablet, Rfl: 1 .  Multiple Vitamins-Minerals (PRESERVISION AREDS 2) CAPS, Take 2 capsules by mouth daily., Disp: , Rfl:  .  MULTIPLE VITAMINS-MINERALS PO, Take by mouth. Reported on 12/06/2015, Disp: , Rfl:  .  Spacer/Aero Chamber Mouthpiece MISC, 1 Units by Does not apply route every 4 (four) hours as needed (wheezing)., Disp: 1 each, Rfl: 0 .  tamsulosin (FLOMAX) 0.4 MG CAPS capsule, Take 1 capsule (0.4 mg total) by mouth as needed., Disp: 30 capsule, Rfl: 5 .  ondansetron (ZOFRAN ODT) 4 MG disintegrating tablet, Take 1 tablet (4 mg total) by mouth every 8 (eight) hours as needed. (Patient not taking: Reported on 01/19/2019), Disp: 20 tablet, Rfl: 0 .  oxyCODONE-acetaminophen (PERCOCET) 5-325 MG tablet, Take 1 tablet by mouth every 4 (four) hours as needed for severe pain. (Patient not taking: Reported on 01/19/2019), Disp: 20 tablet, Rfl: 0  Review of Systems  Constitutional: Negative for appetite change, chills and fever.  Respiratory: Negative for chest tightness, shortness of breath and wheezing.   Cardiovascular: Negative for chest pain and palpitations.  Gastrointestinal: Negative for abdominal pain, nausea and vomiting.    Social History   Tobacco  Use  . Smoking status: Former Smoker    Types: Cigarettes    Last attempt to quit: 09/30/1966    Years since quitting: 52.3  . Smokeless tobacco: Never Used  Substance Use Topics  . Alcohol use: Yes    Alcohol/week: 0.0 - 14.0 standard drinks    Comment: alternates between 1 glass of wine or 1 bourbon a night      Objective:    Vitals:    01/19/19 1411 01/19/19 1415 01/19/19 1421  BP: (!) 143/76 (!) 150/78 140/70  Pulse: 61    Resp: 16    Temp: 98.7 F (37.1 C)    TempSrc: Oral    SpO2: 97%    Weight: 229 lb (103.9 kg)       Physical Exam   General Appearance:    Alert, cooperative, no distress  Eyes:    PERRL, conjunctiva/corneas clear, EOM's intact       Lungs:     Clear to auscultation bilaterally, respirations unlabored  Heart:    Regular rate and rhythm  Neurologic:   Awake, alert, oriented x 3. No apparent focal neurological           defect.          Assessment & Plan    Essential hypertension edema improved with lowered dose amlodipine, BP fairly well controlled with addition of hctz. Likely aggravated by recent tibial plateau fracture.  Continue current medications.  Follow up 3 months.     Lelon Huh, MD  [facility]

## 2019-01-19 NOTE — Patient Instructions (Signed)
.   Please review the attached list of medications and notify my office if there are any errors.   . Please bring all of your medications to every appointment so we can make sure that our medication list is the same as yours.   

## 2019-01-27 DIAGNOSIS — S82142D Displaced bicondylar fracture of left tibia, subsequent encounter for closed fracture with routine healing: Secondary | ICD-10-CM | POA: Diagnosis not present

## 2019-02-08 DIAGNOSIS — H353221 Exudative age-related macular degeneration, left eye, with active choroidal neovascularization: Secondary | ICD-10-CM | POA: Diagnosis not present

## 2019-02-16 DIAGNOSIS — M25562 Pain in left knee: Secondary | ICD-10-CM | POA: Diagnosis not present

## 2019-02-16 DIAGNOSIS — S82142D Displaced bicondylar fracture of left tibia, subsequent encounter for closed fracture with routine healing: Secondary | ICD-10-CM | POA: Diagnosis not present

## 2019-03-09 DIAGNOSIS — S82142D Displaced bicondylar fracture of left tibia, subsequent encounter for closed fracture with routine healing: Secondary | ICD-10-CM | POA: Diagnosis not present

## 2019-03-09 DIAGNOSIS — M25562 Pain in left knee: Secondary | ICD-10-CM | POA: Diagnosis not present

## 2019-03-31 ENCOUNTER — Telehealth: Payer: Self-pay | Admitting: Family Medicine

## 2019-03-31 NOTE — Chronic Care Management (AMB) (Signed)
°  Chronic Care Management   Outreach Note  03/31/2019 Name: Ryan Bentley MRN: [id number] DOB: [birth year]  Referred by: Birdie Sons, MD Reason for referral : Chronic Care Management (Initial CCm outreach was unsuccessful. )   An unsuccessful telephone outreach was attempted today. The patient was referred to the case management team by for assistance with chronic care management and care coordination.   Follow Up Plan: A HIPPA compliant phone message was left for the patient providing contact information and requesting a return call.  The care management team will reach out to the patient again over the next 7 days.  If patient returns call to provider office, please advise to call Vadito at [place]  [email or web address]   ??[phone]

## 2019-04-01 NOTE — Chronic Care Management (AMB) (Signed)
Chronic Care Management   Note  04/01/2019 Name: Ryan Bentley MRN: [id number] DOB: 12-May-1932  Ryan Bentley is a 85 y.o. year old male who is a primary care patient of Fisher, Kirstie Peri, MD. I reached out to Ryan Bentley by phone today in response to a referral sent by Ryan Bentley's health plan.    Ryan Bentley was given information about Chronic Care Management services today including:  1. CCM service includes personalized support from designated clinical staff supervised by his physician, including individualized plan of care and coordination with other care providers 2. 24/7 contact phone numbers for assistance for urgent and routine care needs. 3. Service will only be billed when office clinical staff spend 20 minutes or more in a month to coordinate care. 4. Only one practitioner may furnish and bill the service in a calendar month. 5. The patient may stop CCM services at any time (effective at the end of the month) by phone call to the office staff. 6. The patient will be responsible for cost sharing (co-pay) of up to 20% of the service fee (after annual deductible is met).  Patient did not agree to enrollment in care management services and does not wish to consider at this time.  Follow up plan: The patient has been provided with contact information for the chronic care management team and has been advised to call with any health related questions or concerns.   [place]  [email or web address]   ??[phone]

## 2019-04-27 ENCOUNTER — Other Ambulatory Visit: Payer: Self-pay

## 2019-04-27 ENCOUNTER — Ambulatory Visit (INDEPENDENT_AMBULATORY_CARE_PROVIDER_SITE_OTHER): Payer: Medicare Other | Admitting: Family Medicine

## 2019-04-27 ENCOUNTER — Encounter: Payer: Self-pay | Admitting: Family Medicine

## 2019-04-27 VITALS — BP 132/60 | HR 51 | Temp 97.8°F | Resp 16 | Wt 227.0 lb

## 2019-04-27 DIAGNOSIS — J449 Chronic obstructive pulmonary disease, unspecified: Secondary | ICD-10-CM

## 2019-04-27 DIAGNOSIS — I10 Essential (primary) hypertension: Secondary | ICD-10-CM | POA: Diagnosis not present

## 2019-04-27 NOTE — Patient Instructions (Signed)
.   Please review the attached list of medications and notify my office if there are any errors.   . Please bring all of your medications to every appointment so we can make sure that our medication list is the same as yours.   . We will have flu vaccines available after Labor Day. Please go to your pharmacy or call the office in early September to schedule you flu shot.   

## 2019-04-27 NOTE — Progress Notes (Signed)
Patient: Ryan Bentley Male    DOB: 1932/04/14   85 y.o.   MRN: [id number] Visit Date: 04/27/2019  Today's Provider: Lelon Huh, MD   Chief Complaint  Patient presents with  . Hypertension   Subjective:     HPI  Hypertension, follow-up:  BP Readings from Last 3 Encounters:  04/27/19 132/60  01/19/19 140/70  01/09/19 (!) 170/76    He was last seen for hypertension 3 months ago.  BP at that visit was 140/70. Management since that visit includes none. He reports good compliance with treatment. He is not having side effects.  He is not exercising. He is not adherent to low salt diet.   Outside blood pressures are not being checked. He is experiencing none.  Patient denies chest pain, chest pressure/discomfort, claudication, dyspnea, exertional chest pressure/discomfort, fatigue, irregular heart beat, lower extremity edema, near-syncope, orthopnea, palpitations, paroxysmal nocturnal dyspnea, syncope and tachypnea.   Cardiovascular risk factors include advanced age (older than 36 for men, 64 for women), hypertension and male gender.  Use of agents associated with hypertension: NSAIDS.     Weight trend: stable Wt Readings from Last 3 Encounters:  04/27/19 227 lb (103 kg)  01/19/19 229 lb (103.9 kg)  01/08/19 244 lb (110.7 kg)    Current diet: well balanced  ------------------------------------------------------------------------ Follow up COPD States he is using Advair consistently every day, is having no dyspnea and not having to use albuterol at all.   Allergies  Allergen Reactions  . Iodine Itching  . Ivp Dye [Iodinated Diagnostic Agents] Anxiety    Agitation     Current Outpatient Medications:  .  albuterol (PROVENTIL HFA;VENTOLIN HFA) 108 (90 Base) MCG/ACT inhaler, Inhale 2 puffs into the lungs every 6 (six) hours as needed for wheezing or shortness of breath., Disp: 1 Inhaler, Rfl: 2 .  amLODipine (NORVASC) 5 MG tablet, Take 1 tablet (5 mg  total) by mouth daily., Disp: 90 tablet, Rfl: 1 .  Fluticasone-Salmeterol (ADVAIR) 250-50 MCG/DOSE AEPB, Inhale 1 puff into the lungs 2 (two) times daily., Disp: 3 each, Rfl: 4 .  hydrochlorothiazide (HYDRODIURIL) 25 MG tablet, Take 1 tablet (25 mg total) by mouth daily., Disp: 90 tablet, Rfl: 1 .  Multiple Vitamins-Minerals (PRESERVISION AREDS 2) CAPS, Take 1 capsule by mouth 2 (two) times a day. , Disp: , Rfl:  .  ondansetron (ZOFRAN ODT) 4 MG disintegrating tablet, Take 1 tablet (4 mg total) by mouth every 8 (eight) hours as needed., Disp: 20 tablet, Rfl: 0 .  Spacer/Aero Chamber Mouthpiece MISC, 1 Units by Does not apply route every 4 (four) hours as needed (wheezing)., Disp: 1 each, Rfl: 0 .  tamsulosin (FLOMAX) 0.4 MG CAPS capsule, Take 1 capsule (0.4 mg total) by mouth as needed., Disp: 30 capsule, Rfl: 5  Review of Systems  Constitutional: Negative for appetite change, chills and fever.  Respiratory: Negative for chest tightness, shortness of breath and wheezing.   Cardiovascular: Negative for chest pain and palpitations.  Gastrointestinal: Negative for abdominal pain, nausea and vomiting.    Social History   Tobacco Use  . Smoking status: Former Smoker    Types: Cigarettes    Quit date: 09/30/1966    Years since quitting: 52.6  . Smokeless tobacco: Never Used  Substance Use Topics  . Alcohol use: Yes    Alcohol/week: 0.0 - 14.0 standard drinks    Comment: alternates between 1 glass of wine or 1 bourbon a night  Objective:   BP 132/60 (BP Location: Left Arm, Patient Position: Sitting, Cuff Size: Large)   Pulse (!) 51   Temp 97.8 F (36.6 C) (Oral)   Resp 16   Wt 227 lb (103 kg)   SpO2 99% Comment: room air  BMI 32.57 kg/m  Vitals:   04/27/19 1343  BP: 132/60  Pulse: (!) 51  Resp: 16  Temp: 97.8 F (36.6 C)  TempSrc: Oral  SpO2: 99%  Weight: 227 lb (103 kg)     Physical Exam   General Appearance:    Alert, cooperative, no distress, obese  Eyes:     PERRL, conjunctiva/corneas clear, EOM's intact       Lungs:     Clear to auscultation bilaterally, respirations unlabored  Heart:    Normal heart rate. Normal rhythm. No murmurs, rubs, or gallops.   Neurologic:   Awake, alert, oriented x 3. No apparent focal neurological           defect.           Assessment & Plan    1. Essential hypertension Well controlled.  Continue current medications.   - Renal function panel - CBC  2. Chronic obstructive pulmonary disease, unspecified COPD type ([place]) Doing with with Advair as maintenance inhaler      Lelon Huh, MD  [facility]

## 2019-04-29 LAB — CBC
Hematocrit: 39.9 % (ref 37.5–51.0)
Hemoglobin: 12.8 g/dL — ABNORMAL LOW (ref 13.0–17.7)
MCH: 27.9 pg (ref 26.6–33.0)
MCHC: 32.1 g/dL (ref 31.5–35.7)
MCV: 87 fL (ref 79–97)
Platelets: 229 10*3/uL (ref 150–450)
RBC: 4.58 x10E6/uL (ref 4.14–5.80)
RDW: 14.2 % (ref 11.6–15.4)
WBC: 8 10*3/uL (ref 3.4–10.8)

## 2019-04-29 LAB — RENAL FUNCTION PANEL
Albumin: 4 g/dL (ref 3.6–4.6)
BUN/Creatinine Ratio: 14 (ref 10–24)
BUN: 14 mg/dL (ref 8–27)
CO2: 23 mmol/L (ref 20–29)
Calcium: 9.7 mg/dL (ref 8.6–10.2)
Chloride: 103 mmol/L (ref 96–106)
Creatinine, Ser: 1.03 mg/dL (ref 0.76–1.27)
GFR calc Af Amer: 76 mL/min/{1.73_m2} (ref >59)
GFR calc non Af Amer: 66 mL/min/{1.73_m2} (ref >59)
Glucose: 94 mg/dL (ref 65–99)
Phosphorus: 2.9 mg/dL (ref 2.8–4.1)
Potassium: 4.3 mmol/L (ref 3.5–5.2)
Sodium: 141 mmol/L (ref 134–144)

## 2019-04-30 ENCOUNTER — Telehealth: Payer: Self-pay

## 2019-04-30 NOTE — Telephone Encounter (Signed)
-----   Message from Birdie Sons, MD sent at 04/29/2019  7:02 AM EDT ----- Labs all normal. Continue current medications.  Check labs yearly.

## 2019-04-30 NOTE — Telephone Encounter (Signed)
Pt advised.   Thanks,   -Braelyn Jenson  

## 2019-05-03 DIAGNOSIS — H353221 Exudative age-related macular degeneration, left eye, with active choroidal neovascularization: Secondary | ICD-10-CM | POA: Diagnosis not present

## 2019-05-10 DIAGNOSIS — H43813 Vitreous degeneration, bilateral: Secondary | ICD-10-CM | POA: Diagnosis not present

## 2019-06-02 ENCOUNTER — Other Ambulatory Visit: Payer: Self-pay | Admitting: Family Medicine

## 2019-06-02 DIAGNOSIS — I10 Essential (primary) hypertension: Secondary | ICD-10-CM

## 2019-06-10 ENCOUNTER — Other Ambulatory Visit: Payer: Self-pay | Admitting: Family Medicine

## 2019-06-10 DIAGNOSIS — J449 Chronic obstructive pulmonary disease, unspecified: Secondary | ICD-10-CM

## 2019-07-26 ENCOUNTER — Other Ambulatory Visit: Payer: Self-pay | Admitting: Family Medicine

## 2019-07-26 DIAGNOSIS — N400 Enlarged prostate without lower urinary tract symptoms: Secondary | ICD-10-CM

## 2019-07-26 MED ORDER — TAMSULOSIN HCL 0.4 MG PO CAPS: 0.4000 mg | ORAL_CAPSULE | ORAL | 23 refills | Status: DC | PRN

## 2019-07-26 NOTE — Telephone Encounter (Signed)
Last Ov 04/27/2019 and last RF 03/13/2018

## 2019-07-26 NOTE — Telephone Encounter (Signed)
Pt needs refill on  Flomax 0.4 mg  [facility]

## 2019-08-09 DIAGNOSIS — H353221 Exudative age-related macular degeneration, left eye, with active choroidal neovascularization: Secondary | ICD-10-CM | POA: Diagnosis not present

## 2019-10-12 DIAGNOSIS — Z23 Encounter for immunization: Secondary | ICD-10-CM | POA: Diagnosis not present

## 2019-10-29 ENCOUNTER — Encounter: Payer: Self-pay | Admitting: Family Medicine

## 2019-10-29 ENCOUNTER — Ambulatory Visit (INDEPENDENT_AMBULATORY_CARE_PROVIDER_SITE_OTHER): Payer: Medicare Other | Admitting: Family Medicine

## 2019-10-29 ENCOUNTER — Other Ambulatory Visit: Payer: Self-pay

## 2019-10-29 VITALS — BP 138/60 | HR 52 | Temp 97.1°F | Resp 18 | Wt 236.0 lb

## 2019-10-29 DIAGNOSIS — J449 Chronic obstructive pulmonary disease, unspecified: Secondary | ICD-10-CM | POA: Diagnosis not present

## 2019-10-29 DIAGNOSIS — I10 Essential (primary) hypertension: Secondary | ICD-10-CM

## 2019-10-29 DIAGNOSIS — R413 Other amnesia: Secondary | ICD-10-CM | POA: Diagnosis not present

## 2019-10-29 NOTE — Progress Notes (Signed)
Patient: Ryan Bentley Male    DOB: Jan 18, 1932   86 y.o.   MRN: [id number] Visit Date: 10/29/2019  Today's Provider: Lelon Huh, MD   Chief Complaint  Patient presents with  . Hypertension  . COPD   Subjective:     HPI  Hypertension, follow-up:  BP Readings from Last 3 Encounters:  10/29/19 138/60  04/27/19 132/60  01/19/19 140/70    He was last seen for hypertension 6 months ago.  BP at that visit was 132/60. Management since that visit includes no changes. He reports good compliance with treatment. He is not having side effects.  He is not exercising. He is not adherent to low salt diet.   Outside blood pressures are not being checked. He is experiencing none.  Patient denies chest pain, chest pressure/discomfort, claudication, dyspnea, exertional chest pressure/discomfort, fatigue, irregular heart beat, lower extremity edema, near-syncope, orthopnea, palpitations, paroxysmal nocturnal dyspnea, syncope and tachypnea.   Cardiovascular risk factors include advanced age (older than 37 for men, 81 for women), hypertension and male gender.  Use of agents associated with hypertension: none.     Weight trend: increasing steadily Wt Readings from Last 3 Encounters:  10/29/19 236 lb (107 kg)  04/27/19 227 lb (103 kg)  01/19/19 229 lb (103.9 kg)    Current diet: in general, an "unhealthy" diet  ------------------------------------------------------------------------  Follow up for COPD:  The patient was last seen for this 6 months ago. Changes made at last visit include none; symptoms controlled with Advair maintenance inhaler.  He reports good compliance with treatment. He feels that condition is Unchanged. He is not having side effects.   ------------------------------------------------------------------------------------ He also states he has been having more trouble concentrating, feels like having some brain fog. Having trouble concentrating when  he has puzzles to do and is trying to learn to use MacOs and just can't seem to get it. Has not been nearly as active the last several months due to the pandemic. States he is sleeping well and feels refreshed in the morning, but gets a little sleepy in the afternoon.    Allergies  Allergen Reactions  . Iodine Itching  . Ivp Dye [Iodinated Diagnostic Agents] Anxiety    Agitation     Current Outpatient Medications:  .  albuterol (PROVENTIL HFA;VENTOLIN HFA) 108 (90 Base) MCG/ACT inhaler, Inhale 2 puffs into the lungs every 6 (six) hours as needed for wheezing or shortness of breath., Disp: 1 Inhaler, Rfl: 2 .  amLODipine (NORVASC) 5 MG tablet, TAKE 1 TABLET BY MOUTH EVERY DAY, Disp: 90 tablet, Rfl: 4 .  hydrochlorothiazide (HYDRODIURIL) 25 MG tablet, TAKE 1 TABLET BY MOUTH EVERY DAY, Disp: 90 tablet, Rfl: 4 .  Multiple Vitamins-Minerals (PRESERVISION AREDS 2) CAPS, Take 1 capsule by mouth 2 (two) times a day. , Disp: , Rfl:  .  Omega-3 Fatty Acids (FISH OIL PO), Take 1 capsule by mouth daily., Disp: , Rfl:  .  Spacer/Aero Chamber Mouthpiece MISC, 1 Units by Does not apply route every 4 (four) hours as needed (wheezing)., Disp: 1 each, Rfl: 0 .  tamsulosin (FLOMAX) 0.4 MG CAPS capsule, Take 1 capsule (0.4 mg total) by mouth as needed., Disp: 30 capsule, Rfl: 23 .  WIXELA INHUB 250-50 MCG/DOSE AEPB, TAKE 1 PUFF BY MOUTH TWICE A DAY, Disp: 180 each, Rfl: 4 .  ondansetron (ZOFRAN ODT) 4 MG disintegrating tablet, Take 1 tablet (4 mg total) by mouth every 8 (eight) hours as needed. (Patient not taking:  Reported on 10/29/2019), Disp: 20 tablet, Rfl: 0  Review of Systems  Constitutional: Negative for appetite change, chills and fever.  Respiratory: Negative for chest tightness, shortness of breath and wheezing.   Cardiovascular: Negative for chest pain and palpitations.  Gastrointestinal: Negative for abdominal pain, nausea and vomiting.    Social History   Tobacco Use  . Smoking status: Former  Smoker    Types: Cigarettes    Quit date: 09/30/1966    Years since quitting: 53.1  . Smokeless tobacco: Never Used  Substance Use Topics  . Alcohol use: Yes    Alcohol/week: 0.0 - 14.0 standard drinks    Comment: alternates between 1 glass of wine or 1 bourbon a night      Objective:   BP 138/60 (BP Location: Left Arm, Patient Position: Sitting, Cuff Size: Large)   Pulse (!) 52   Temp (!) 97.1 F (36.2 C) (Temporal)   Resp 18   Wt 236 lb (107 kg)   SpO2 98% Comment: room air  BMI 33.86 kg/m  Vitals:   10/29/19 1342  BP: 138/60  Pulse: (!) 52  Resp: 18  Temp: (!) 97.1 F (36.2 C)  TempSrc: Temporal  SpO2: 98%  Weight: 236 lb (107 kg)  Body mass index is 33.86 kg/m.   Physical Exam   General: Appearance:    Mildly obese male in no acute distress  Eyes:    PERRL, conjunctiva/corneas clear, EOM's intact       Lungs:     Clear to auscultation bilaterally, respirations unlabored  Heart:    Bradycardic. Normal rhythm. No murmurs, rubs, or gallops.   MS:   All extremities are intact.   Neurologic:   Awake, alert, oriented x 3. No apparent focal neurological           defect.        No results found for any visits on 10/29/19.     Assessment & Plan    1. Essential hypertension Well controlled.  Continue current medications.    2. Chronic obstructive pulmonary disease, unspecified COPD type ([place]) Doing well with current inhaler.   3. Memory change Likely related to some depression with pandemic and lack of physical activity. Encouraged to engage in more physical activity. Check labs.  - Comprehensive metabolic panel - CBC - TSH - Vitamin B12  Consider bupropion if not improving in the next few months.   The entirety of the information documented in the History of Present Illness, Review of Systems and Physical Exam were personally obtained by me. Portions of this information were initially documented by Meyer Cory, CMA and reviewed by me for thoroughness  and accuracy.      Lelon Huh, MD  [facility]

## 2019-10-30 LAB — COMPREHENSIVE METABOLIC PANEL
ALT: 10 IU/L (ref 0–44)
AST: 17 IU/L (ref 0–40)
Albumin/Globulin Ratio: 1.7 (ref 1.2–2.2)
Albumin: 3.9 g/dL (ref 3.6–4.6)
Alkaline Phosphatase: 100 IU/L (ref 39–117)
BUN/Creatinine Ratio: 17 (ref 10–24)
BUN: 18 mg/dL (ref 8–27)
Bilirubin Total: 0.4 mg/dL (ref 0.0–1.2)
CO2: 24 mmol/L (ref 20–29)
Calcium: 9.6 mg/dL (ref 8.6–10.2)
Chloride: 105 mmol/L (ref 96–106)
Creatinine, Ser: 1.06 mg/dL (ref 0.76–1.27)
GFR calc Af Amer: 74 mL/min/{1.73_m2} (ref >59)
GFR calc non Af Amer: 64 mL/min/{1.73_m2} (ref >59)
Globulin, Total: 2.3 g/dL (ref 1.5–4.5)
Glucose: 82 mg/dL (ref 65–99)
Potassium: 3.7 mmol/L (ref 3.5–5.2)
Sodium: 142 mmol/L (ref 134–144)
Total Protein: 6.2 g/dL (ref 6.0–8.5)

## 2019-10-30 LAB — CBC
Hematocrit: 41.5 % (ref 37.5–51.0)
Hemoglobin: 14 g/dL (ref 13.0–17.7)
MCH: 28.6 pg (ref 26.6–33.0)
MCHC: 33.7 g/dL (ref 31.5–35.7)
MCV: 85 fL (ref 79–97)
Platelets: 238 10*3/uL (ref 150–450)
RBC: 4.89 x10E6/uL (ref 4.14–5.80)
RDW: 13.1 % (ref 11.6–15.4)
WBC: 7.7 10*3/uL (ref 3.4–10.8)

## 2019-10-30 LAB — TSH: TSH: 1.56 u[IU]/mL (ref 0.450–4.500)

## 2019-10-30 LAB — VITAMIN B12: Vitamin B-12: 271 pg/mL (ref 232–1245)

## 2019-11-08 DIAGNOSIS — H353221 Exudative age-related macular degeneration, left eye, with active choroidal neovascularization: Secondary | ICD-10-CM | POA: Diagnosis not present

## 2019-11-09 DIAGNOSIS — Z23 Encounter for immunization: Secondary | ICD-10-CM | POA: Diagnosis not present

## 2019-11-22 NOTE — Progress Notes (Signed)
Subjective:   Ryan Bentley is a 86 y.o. male who presents for Medicare Annual/Subsequent preventive examination.    This visit is being conducted through telemedicine due to the COVID-19 pandemic. This patient has given me verbal consent via doximity to conduct this visit, patient states they are participating from their home address. Some vital signs may be absent or patient reported.    Patient identification: identified by name, DOB, and current address  Review of Systems:  N/A  Cardiac Risk Factors include: advanced age (>42men, >63 women);male gender;hypertension     Objective:    Vitals: There were no vitals taken for this visit.  There is no height or weight on file to calculate BMI. Unable to obtain vitals due to visit being conducted via telephonically.   Advanced Directives 11/23/2019 01/09/2019 01/08/2019 11/20/2018 11/13/2017 10/22/2016 05/08/2015  Does Patient Have a Medical Advance Directive? Yes Yes Yes Yes Yes Yes No;Yes  Type of Paramedic of [place];Living will [place];Out of facility DNR (pink MOST or yellow form) [place];Out of facility DNR (pink MOST or yellow form);Living will [place];Living will [place];Living will Living will;Healthcare Power of Attorney Living will  Does patient want to make changes to medical advance directive? - - - - - - -  Copy of [place] in Chart? No - copy requested - No - copy requested No - copy requested No - copy requested Yes -  Would patient like information on creating a medical advance directive? - - - - - - No - patient declined information    Tobacco Social History   Tobacco Use  Smoking Status Former Smoker  . Types: Cigarettes  . Quit date: 09/30/1966  . Years since quitting: 53.1  Smokeless Tobacco Never Used     Counseling given: Not Answered   Clinical Intake:  Pre-visit preparation  completed: Yes  Pain : No/denies pain Pain Score: 0-No pain     Nutritional Risks: None Diabetes: No  How often do you need to have someone help you when you read instructions, pamphlets, or other written materials from your doctor or pharmacy?: 1 - Never  Interpreter Needed?: No  Information entered by :: [facility], LPN  Past Medical History:  Diagnosis Date  . Arthritis   . Asthma   . C. difficile colitis 08/23/2016  . History of colon polyps    All benign per patient report  . History of echocardiogram    a. 03/2015: a. EF 55-60%, no WMA, nl diastolic fxn, trivial TR, nl PASP  . Hypertension   . Macular degeneration   . Migraine headache    Past Surgical History:  Procedure Laterality Date  . APPENDECTOMY    . CATARACT EXTRACTION     x2  . DENTAL SURGERY  10/2010  . TONSILLECTOMY AND ADENOIDECTOMY  1940's   Family History  Problem Relation Age of Onset  . Heart disease Mother    Social History   Socioeconomic History  . Marital status: Widowed    Spouse name: Not on file  . Number of children: 2  . Years of education: Not on file  . Highest education level: Bachelor's degree (e.g., BA, AB, BS)  Occupational History  . Occupation: retired  Tobacco Use  . Smoking status: Former Smoker    Types: Cigarettes    Quit date: 09/30/1966    Years since quitting: 53.1  . Smokeless tobacco: Never Used  Substance and Sexual Activity  . Alcohol use: Yes    Alcohol/week: 0.0 - 14.0 standard drinks    Comment: alternates between 1 glass of wine or 1 bourbon a night  . Drug use: No  . Sexual activity: Never  Other Topics Concern  . Not on file  Social History Narrative  . Not on file   Social Determinants of Health   Financial Resource Strain: Low Risk   . Difficulty of Paying Living Expenses: Not hard at all  Food Insecurity: No Food Insecurity  . Worried About Charity fundraiser in the Last Year: Never true  . Ran Out of Food in the Last Year: Never true    Transportation Needs: No Transportation Needs  . Lack of Transportation (Medical): No  . Lack of Transportation (Non-Medical): No  Physical Activity: Inactive  . Days of Exercise per Week: 0 days  . Minutes of Exercise per Session: 0 min  Stress: No Stress Concern Present  . Feeling of Stress : Not at all  Social Connections: Moderately Isolated  . Frequency of Communication with Friends and Family: More than three times a week  . Frequency of Social Gatherings with Friends and Family: Never  . Attends Religious Services: Never  . Active Member of Clubs or Organizations: No  . Attends Archivist Meetings: Never  . Marital Status: Widowed    Outpatient Encounter Medications as of 11/23/2019  Medication Sig  . albuterol (PROVENTIL HFA;VENTOLIN HFA) 108 (90 Base) MCG/ACT inhaler Inhale 2 puffs into the lungs every 6 (six) hours as needed for wheezing or shortness of breath.  Marland Kitchen amLODipine (NORVASC) 5 MG tablet TAKE 1 TABLET BY MOUTH EVERY DAY  . hydrochlorothiazide (HYDRODIURIL) 25 MG tablet TAKE 1 TABLET BY MOUTH EVERY DAY  . Multiple Vitamins-Minerals (PRESERVISION AREDS 2) CAPS Take 1 capsule by mouth 2 (two) times a day.   . Omega-3 Fatty Acids (FISH OIL PO) Take 1 capsule by mouth daily.  Marland Kitchen Spacer/Aero Chamber Mouthpiece MISC 1 Units by Does not apply route every 4 (four) hours as needed (wheezing).  . tamsulosin (FLOMAX) 0.4 MG CAPS capsule Take 1 capsule (0.4 mg total) by mouth as needed.  Grant Ruts INHUB 250-50 MCG/DOSE AEPB TAKE 1 PUFF BY MOUTH TWICE A DAY  . ondansetron (ZOFRAN ODT) 4 MG disintegrating tablet Take 1 tablet (4 mg total) by mouth every 8 (eight) hours as needed. (Patient not taking: Reported on 10/29/2019)   No facility-administered encounter medications on file as of 11/23/2019.    Activities of Daily Living In your present state of health, do you have any difficulty performing the following activities: 11/23/2019  Hearing? N  Vision? Y  Difficulty  concentrating or making decisions? N  Walking or climbing stairs? N  Dressing or bathing? N  Doing errands, shopping? N  Preparing Food and eating ? N  Using the Toilet? N  In the past six months, have you accidently leaked urine? N  Do you have problems with loss of bowel control? N  Managing your Medications? N  Managing your Finances? N  Housekeeping or managing your Housekeeping? N  Some recent data might be hidden    Patient Care Team: Birdie Sons, MD as PCP - General (Family Medicine) Isaias Sakai, MD as Referring Physician (Ophthalmology)   Assessment:   This is a routine wellness examination for [place].  Exercise Activities and Dietary recommendations Current Exercise Habits: The patient does not participate in regular exercise at present, Exercise limited  by: orthopedic condition(s)  Goals    . DIET - REDUCE CALORIE INTAKE     Recommend to monitor junk food intake and to limit the amount of salt and sweets in daily diet to help aid in weight loss.     . Increase water intake     Starting 10/22/16, I will continue to drink 6-8 glasses of water a day.    Marland Kitchen LIFESTYLE - DECREASE FALLS RISK     Recommend to remove any items from the home that may cause slips or trips.       Fall Risk: Fall Risk  11/23/2019 11/20/2018 11/13/2017 10/22/2016 12/06/2015  Falls in the past year? 0 0 No No No  Number falls in past yr: 0 - - - -  Comment tripped - - - -  Injury with Fall? 1 - - - -  Comment fractured knee - - - -  Follow up Falls prevention discussed - - - -    FALL RISK PREVENTION PERTAINING TO THE HOME:  Any stairs in or around the home? Yes  If so, are there any without handrails? No   Home free of loose throw rugs in walkways, pet beds, electrical cords, etc? Yes  Adequate lighting in your home to reduce risk of falls? Yes   ASSISTIVE DEVICES UTILIZED TO PREVENT FALLS:  Life alert? No  Use of a cane, walker or w/c? No  Grab bars in the bathroom? Yes Shower  chair or bench in shower? No  Elevated toilet seat or a handicapped toilet? No   TIMED UP AND GO:  Was the test performed? No .    Depression Screen PHQ 2/9 Scores 11/23/2019 11/20/2018 11/13/2017 10/22/2016  PHQ - 2 Score 0 0 0 1    Cognitive Function     6CIT Screen 11/23/2019 11/20/2018 10/22/2016  What Year? 0 points 0 points 0 points  What month? 0 points 0 points 0 points  What time? 0 points 0 points 0 points  Count back from 20 0 points 0 points 0 points  Months in reverse 0 points 0 points 0 points  Repeat phrase 0 points 0 points 0 points  Total Score 0 0 0    Immunization History  Administered Date(s) Administered  . Influenza, High Dose Seasonal PF 08/04/2017, 07/30/2018  . Influenza-Unspecified 08/01/2015, 07/26/2016, 07/20/2019  . Pneumococcal Conjugate-13 11/30/2014  . Pneumococcal Polysaccharide-23 08/22/2008  . Zoster 12/06/2015    Qualifies for Shingles Vaccine? Yes  Zostavax completed 12/06/15. Due for Shingrix. Pt has been advised to call insurance company to determine out of pocket expense. Advised may also receive vaccine at local pharmacy or Health Dept. Verbalized acceptance and understanding.  Tdap: Up to date  Flu Vaccine: Up to date  Pneumococcal Vaccine: Completed series  Screening Tests Health Maintenance  Topic Date Due  . TETANUS/TDAP  04/27/2023  . INFLUENZA VACCINE  Completed  . PNA vac Low Risk Adult  Completed   Cancer Screenings:  Colorectal Screening: No longer required.   Lung Cancer Screening: (Low Dose CT Chest recommended if Age 34-80 years, 30 pack-year currently smoking OR have quit w/in 15years.) does not qualify.   Additional Screening:  Vision Screening: Recommended annual ophthalmology exams for early detection of glaucoma and other disorders of the eye.  Dental Screening: Recommended annual dental exams for proper oral hygiene  Community Resource Referral:  CRR required this visit?  No        Plan:  I have  personally reviewed and  addressed the Medicare Annual Wellness questionnaire and have noted the following in the patient's chart:  A. Medical and social history B. Use of alcohol, tobacco or illicit drugs  C. Current medications and supplements D. Functional ability and status E.  Nutritional status F.  Physical activity G. Advance directives H. List of other physicians I.  Hospitalizations, surgeries, and ER visits in previous 12 months J.  [place] such as hearing and vision if needed, cognitive and depression L. Referrals and appointments   In addition, I have reviewed and discussed with patient certain preventive protocols, quality metrics, and best practice recommendations. A written personalized care plan for preventive services as well as general preventive health recommendations were provided to patient.   [place] Score, [place]  [id number] Nurse Health Advisor   Nurse Notes: None.

## 2019-11-23 ENCOUNTER — Ambulatory Visit (INDEPENDENT_AMBULATORY_CARE_PROVIDER_SITE_OTHER): Payer: Medicare Other

## 2019-11-23 ENCOUNTER — Other Ambulatory Visit: Payer: Self-pay

## 2019-11-23 DIAGNOSIS — Z0000 Encounter for general adult medical examination without abnormal findings: Secondary | ICD-10-CM | POA: Diagnosis not present

## 2019-11-23 NOTE — Patient Instructions (Signed)
Ryan Bentley , Thank you for taking time to come for your Medicare Wellness Visit. I appreciate your ongoing commitment to your health goals. Please review the following plan we discussed and let me know if I can assist you in the future.   Screening recommendations/referrals: Colonoscopy: No longer required.  Recommended yearly ophthalmology/optometry visit for glaucoma screening and checkup Recommended yearly dental visit for hygiene and checkup  Vaccinations: Influenza vaccine: Up to date Pneumococcal vaccine: Completed series Tdap vaccine: Up to date, due 03/2023 Shingles vaccine: Pt declines today.     Advanced directives: Please bring a copy of your POA (Power of Attorney) and/or Living Will to your next appointment.   Conditions/risks identified: Fall risk prevention discussed today. Recommend to increase water intake to 6-8 8 oz glasses a day and continue to cut back on junk foods and high sodium foods to help aid with weight loss.   Next appointment: 02/01/20 @ 2:00 PM with Dr Caryn Section. Declined scheduling an AWV for 2022 at this time.   Preventive Care 44 Years and Older, Male Preventive care refers to lifestyle choices and visits with your health care provider that can promote health and wellness. What does preventive care include?  A yearly physical exam. This is also called an annual well check.  Dental exams once or twice a year.  Routine eye exams. Ask your health care provider how often you should have your eyes checked.  Personal lifestyle choices, including:  Daily care of your teeth and gums.  Regular physical activity.  Eating a healthy diet.  Avoiding tobacco and drug use.  Limiting alcohol use.  Practicing safe sex.  Taking low doses of aspirin every day.  Taking vitamin and mineral supplements as recommended by your health care provider. What happens during an annual well check? The services and screenings done by your health care provider during your  annual well check will depend on your age, overall health, lifestyle risk factors, and family history of disease. Counseling  Your health care provider may ask you questions about your:  Alcohol use.  Tobacco use.  Drug use.  Emotional well-being.  Home and relationship well-being.  Sexual activity.  Eating habits.  History of falls.  Memory and ability to understand (cognition).  Work and work Statistician. Screening  You may have the following tests or measurements:  Height, weight, and BMI.  Blood pressure.  Lipid and cholesterol levels. These may be checked every 5 years, or more frequently if you are over 39 years old.  Skin check.  Lung cancer screening. You may have this screening every year starting at age 78 if you have a 30-pack-year history of smoking and currently smoke or have quit within the past 15 years.  Fecal occult blood test (FOBT) of the stool. You may have this test every year starting at age 42.  Flexible sigmoidoscopy or colonoscopy. You may have a sigmoidoscopy every 5 years or a colonoscopy every 10 years starting at age 37.  Prostate cancer screening. Recommendations will vary depending on your family history and other risks.  Hepatitis C blood test.  Hepatitis B blood test.  Sexually transmitted disease (STD) testing.  Diabetes screening. This is done by checking your blood sugar (glucose) after you have not eaten for a while (fasting). You may have this done every 1-3 years.  Abdominal aortic aneurysm (AAA) screening. You may need this if you are a current or former smoker.  Osteoporosis. You may be screened starting at age 37 if  you are at high risk. Talk with your health care provider about your test results, treatment options, and if necessary, the need for more tests. Vaccines  Your health care provider may recommend certain vaccines, such as:  Influenza vaccine. This is recommended every year.  Tetanus, diphtheria, and  acellular pertussis (Tdap, Td) vaccine. You may need a Td booster every 10 years.  Zoster vaccine. You may need this after age 10.  Pneumococcal 13-valent conjugate (PCV13) vaccine. One dose is recommended after age 65.  Pneumococcal polysaccharide (PPSV23) vaccine. One dose is recommended after age 20. Talk to your health care provider about which screenings and vaccines you need and how often you need them. This information is not intended to replace advice given to you by your health care provider. Make sure you discuss any questions you have with your health care provider. Document Released: 10/13/2015 Document Revised: 06/05/2016 Document Reviewed: 07/18/2015 Elsevier Interactive Patient Education  2017 [place] Prevention in the Home Falls can cause injuries. They can happen to people of all ages. There are many things you can do to make your home safe and to help prevent falls. What can I do on the outside of my home?  Regularly fix the edges of walkways and driveways and fix any cracks.  Remove anything that might make you trip as you walk through a door, such as a raised step or threshold.  Trim any bushes or trees on the path to your home.  Use bright outdoor lighting.  Clear any walking paths of anything that might make someone trip, such as rocks or tools.  Regularly check to see if handrails are loose or broken. Make sure that both sides of any steps have handrails.  Any raised decks and porches should have guardrails on the edges.  Have any leaves, snow, or ice cleared regularly.  Use sand or salt on walking paths during winter.  Clean up any spills in your garage right away. This includes oil or grease spills. What can I do in the bathroom?  Use night lights.  Install grab bars by the toilet and in the tub and shower. Do not use towel bars as grab bars.  Use non-skid mats or decals in the tub or shower.  If you need to sit down in the shower, use  a plastic, non-slip stool.  Keep the floor dry. Clean up any water that spills on the floor as soon as it happens.  Remove soap buildup in the tub or shower regularly.  Attach bath mats securely with double-sided non-slip rug tape.  Do not have throw rugs and other things on the floor that can make you trip. What can I do in the bedroom?  Use night lights.  Make sure that you have a light by your bed that is easy to reach.  Do not use any sheets or blankets that are too big for your bed. They should not hang down onto the floor.  Have a firm chair that has side arms. You can use this for support while you get dressed.  Do not have throw rugs and other things on the floor that can make you trip. What can I do in the kitchen?  Clean up any spills right away.  Avoid walking on wet floors.  Keep items that you use a lot in easy-to-reach places.  If you need to reach something above you, use a strong step stool that has a grab bar.  Keep electrical cords  out of the way.  Do not use floor polish or wax that makes floors slippery. If you must use wax, use non-skid floor wax.  Do not have throw rugs and other things on the floor that can make you trip. What can I do with my stairs?  Do not leave any items on the stairs.  Make sure that there are handrails on both sides of the stairs and use them. Fix handrails that are broken or loose. Make sure that handrails are as long as the stairways.  Check any carpeting to make sure that it is firmly attached to the stairs. Fix any carpet that is loose or worn.  Avoid having throw rugs at the top or bottom of the stairs. If you do have throw rugs, attach them to the floor with carpet tape.  Make sure that you have a light switch at the top of the stairs and the bottom of the stairs. If you do not have them, ask someone to add them for you. What else can I do to help prevent falls?  Wear shoes that:  Do not have high heels.  Have  rubber bottoms.  Are comfortable and fit you well.  Are closed at the toe. Do not wear sandals.  If you use a stepladder:  Make sure that it is fully opened. Do not climb a closed stepladder.  Make sure that both sides of the stepladder are locked into place.  Ask someone to hold it for you, if possible.  Clearly mark and make sure that you can see:  Any grab bars or handrails.  First and last steps.  Where the edge of each step is.  Use tools that help you move around (mobility aids) if they are needed. These include:  Canes.  Walkers.  Scooters.  Crutches.  Turn on the lights when you go into a dark area. Replace any light bulbs as soon as they burn out.  Set up your furniture so you have a clear path. Avoid moving your furniture around.  If any of your floors are uneven, fix them.  If there are any pets around you, be aware of where they are.  Review your medicines with your doctor. Some medicines can make you feel dizzy. This can increase your chance of falling. Ask your doctor what other things that you can do to help prevent falls. This information is not intended to replace advice given to you by your health care provider. Make sure you discuss any questions you have with your health care provider. Document Released: 07/13/2009 Document Revised: 02/22/2016 Document Reviewed: 10/21/2014 Elsevier Interactive Patient Education  2017 [employer].

## 2020-01-10 ENCOUNTER — Telehealth: Payer: Self-pay

## 2020-01-10 NOTE — Telephone Encounter (Signed)
Copied from [place] [phone]. Topic: General - Other >> Jan 10, 2020  9:48 AM Rainey Pines A wrote: Patient would like to speak with nurse in regards to the date of his last tetanus shot. Please advise

## 2020-01-10 NOTE — Telephone Encounter (Signed)
Patient advise that there is not documentation of a tetanus vaccine in his chart.

## 2020-01-31 NOTE — Progress Notes (Signed)
I,Roshena L Chambers,acting as a scribe for Lelon Huh, MD.,have documented all relevant documentation on the behalf of Lelon Huh, MD,as directed by  Lelon Huh, MD while in the presence of Lelon Huh, MD.   Established patient visit   Patient: Ryan Bentley   DOB: [birth year]   86 y.o. Male  MRN: [id number] Visit Date: 02/01/2020  Today's healthcare provider: Lelon Huh, MD   Chief Complaint  Patient presents with  . Hypertension  . COPD   Subjective    HPI  Patient had a AWE with [facility] on 11/23/2019  Hypertension, follow-up  BP Readings from Last 3 Encounters:  02/01/20 (!) 122/54  10/29/19 138/60  04/27/19 132/60   Wt Readings from Last 3 Encounters:  02/01/20 234 lb (106.1 kg)  10/29/19 236 lb (107 kg)  04/27/19 227 lb (103 kg)     He was last seen for hypertension 4 months ago.  BP at that visit was 138/60. Management since that visit includes no change.  He reports good compliance with treatment. He is not having side effects.  He is following a Regular diet. He is not exercising. He does not smoke.  Use of agents associated with hypertension: none.   Outside blood pressures are not checked. Symptoms: No chest pain No chest pressure No palpitations No dyspnea No orthopnea No paroxysmal nocturnal dyspnea No lower extremity edema No syncope   Pertinent labs: No results found for: CHOL, HDL, LDLCALC, LDLDIRECT, TRIG, CHOLHDL Lab Results  Component Value Date   NA 142 10/29/2019   K 3.7 10/29/2019   CO2 24 10/29/2019   GLUCOSE 82 10/29/2019   BUN 18 10/29/2019   CREATININE 1.06 10/29/2019   CALCIUM 9.6 10/29/2019   GFRNONAA 64 10/29/2019   GFRAA 74 10/29/2019     The ASCVD Risk score (Goff DC Jr., et al., 2013) failed to calculate for the following reasons:   The 2013 ASCVD risk score is only valid for ages 33 to 55    --------------------------------------------------------------------------------------------------- COPD, Follow up  He was last seen for this 4 months ago. Changes made include no change.   He reports good compliance with treatment. He is not having side effects.  he uses rescue inhaler rarely. He IS experiencing no current symptoms. he reports breathing is stable.  Pulmonary Functions Testing Results:  No results found for: FEV1, FVC, FEV1FVC, TLC  Dizziness: Patient complains of dizziness for the past 4-5 weeks. Symptoms started suddenly one night and had been ongoing since then. He describes the dizziness as feeling off balance and as if the room is spinning. Dizziness improves with siting upright and turning head from side to side. He reports that dizziness is slightly improved since onset. He hasn't tried any medications to help with symptoms. He denies any nausea or vomiting.   Patient feels that social isolation from the [place] pandemic has caused him to become somewhat depressed since he is unable to participate in church activities.      Medications: Outpatient Medications Prior to Visit  Medication Sig  . albuterol (PROVENTIL HFA;VENTOLIN HFA) 108 (90 Base) MCG/ACT inhaler Inhale 2 puffs into the lungs every 6 (six) hours as needed for wheezing or shortness of breath.  Marland Kitchen amLODipine (NORVASC) 5 MG tablet TAKE 1 TABLET BY MOUTH EVERY DAY  . hydrochlorothiazide (HYDRODIURIL) 25 MG tablet TAKE 1 TABLET BY MOUTH EVERY DAY  . Multiple Vitamins-Minerals (PRESERVISION AREDS 2) CAPS Take 1 capsule by mouth 2 (two) times a day.   Marland Kitchen  Omega-3 Fatty Acids (FISH OIL PO) Take 1 capsule by mouth daily.  Marland Kitchen Spacer/Aero Chamber Mouthpiece MISC 1 Units by Does not apply route every 4 (four) hours as needed (wheezing).  . tamsulosin (FLOMAX) 0.4 MG CAPS capsule Take 1 capsule (0.4 mg total) by mouth as needed.  Grant Ruts INHUB 250-50 MCG/DOSE AEPB TAKE 1 PUFF BY MOUTH TWICE A DAY  .  [DISCONTINUED] ondansetron (ZOFRAN ODT) 4 MG disintegrating tablet Take 1 tablet (4 mg total) by mouth every 8 (eight) hours as needed. (Patient not taking: Reported on 10/29/2019)   No facility-administered medications prior to visit.    Review of Systems  Constitutional: Negative for appetite change, chills and fever.  Respiratory: Negative for chest tightness, shortness of breath and wheezing.   Cardiovascular: Negative for chest pain and palpitations.  Gastrointestinal: Negative for abdominal pain, nausea and vomiting.  Neurological: Positive for dizziness.      Objective    BP (!) 122/54 (BP Location: Left Arm, Patient Position: Sitting, Cuff Size: Large)   Pulse 64   Temp (!) 97.5 F (36.4 C) (Temporal)   Resp 20   Ht 5\' 10"  (1.778 m)   Wt 234 lb (106.1 kg)   SpO2 98% Comment: room air  BMI 33.58 kg/m    Physical Exam   General: Appearance:    Obese male in no acute distress  Eyes:    PERRL, conjunctiva/corneas clear, EOM's intact       Lungs:     Clear to auscultation bilaterally, respirations unlabored  Heart:    Normal heart rate. Normal rhythm. No murmurs, rubs, or gallops.   MS:   All extremities are intact.   Neurologic:   Awake, alert, oriented x 3. No apparent focal neurological           defect.        No results found for any visits on 02/01/20.  Assessment & Plan     1. Essential hypertension Well controlled. Continue current medications. F/u in 6 months  2. Chronic obstructive pulmonary disease, unspecified COPD type ([place]) Stable on daily use of Wixela inhaler. Rare use of Albuterol rescue inhaler.   3. Vertigo New. Symptoms seem to be improving since onset 4-5 weeks ago. Can try OTC Dramamine. If symptoms persist will refer to ENT for further evaluation    Return in about 6 months (around 08/03/2020) for HTN follow up.      The entirety of the information documented in the History of Present Illness, Review of Systems and Physical Exam were  personally obtained by me. Portions of this information were initially documented by the CMA and reviewed by me for thoroughness and accuracy.     Lelon Huh, MD  [facility] [phone] (phone) [phone] (fax)  [place]

## 2020-02-01 ENCOUNTER — Other Ambulatory Visit: Payer: Self-pay

## 2020-02-01 ENCOUNTER — Ambulatory Visit (INDEPENDENT_AMBULATORY_CARE_PROVIDER_SITE_OTHER): Payer: Medicare Other | Admitting: Family Medicine

## 2020-02-01 ENCOUNTER — Encounter: Payer: Self-pay | Admitting: Family Medicine

## 2020-02-01 VITALS — BP 122/54 | HR 64 | Temp 97.5°F | Resp 20 | Ht 70.0 in | Wt 234.0 lb

## 2020-02-01 DIAGNOSIS — J449 Chronic obstructive pulmonary disease, unspecified: Secondary | ICD-10-CM | POA: Diagnosis not present

## 2020-02-01 DIAGNOSIS — R42 Dizziness and giddiness: Secondary | ICD-10-CM

## 2020-02-01 DIAGNOSIS — I10 Essential (primary) hypertension: Secondary | ICD-10-CM | POA: Diagnosis not present

## 2020-02-01 NOTE — Patient Instructions (Signed)
.   Please review the attached list of medications and notify my office if there are any errors.   . Please bring all of your medications to every appointment so we can make sure that our medication list is the same as yours.   . It is recommended to engage in 150 minutes of moderate exercise every week.

## 2020-02-14 DIAGNOSIS — H353221 Exudative age-related macular degeneration, left eye, with active choroidal neovascularization: Secondary | ICD-10-CM | POA: Diagnosis not present

## 2020-05-03 NOTE — Progress Notes (Signed)
    I,Roshena L Chambers,acting as a scribe for Lelon Huh, MD.,have documented all relevant documentation on the behalf of Lelon Huh, MD,as directed by  Lelon Huh, MD while in the presence of Lelon Huh, MD. Established patient visit   Patient: Ryan Bentley   DOB: [birth year]   86 y.o. Male  MRN: [id number] Visit Date: 05/05/2020  Today's healthcare provider: Lelon Huh, MD   Chief Complaint  Patient presents with  . Nail Problem   Subjective    HPI  Toenail Fungus:  Patient complains of a toenail fungus on both great toes. He states the toe nails have a dark substance underneath the nail bed. He has been treating then with an OTC anit-fungal solution and apple cider vinegar. He reports the fungus appeared several months ago and has slightly improved. He denies any pain or swelling.      Medications: Outpatient Medications Prior to Visit  Medication Sig  . albuterol (PROVENTIL HFA;VENTOLIN HFA) 108 (90 Base) MCG/ACT inhaler Inhale 2 puffs into the lungs every 6 (six) hours as needed for wheezing or shortness of breath.  Marland Kitchen amLODipine (NORVASC) 5 MG tablet TAKE 1 TABLET BY MOUTH EVERY DAY  . hydrochlorothiazide (HYDRODIURIL) 25 MG tablet TAKE 1 TABLET BY MOUTH EVERY DAY  . Multiple Vitamins-Minerals (PRESERVISION AREDS 2) CAPS Take 1 capsule by mouth 2 (two) times a day.   . Omega-3 Fatty Acids (FISH OIL PO) Take 1 capsule by mouth daily.  Marland Kitchen Spacer/Aero Chamber Mouthpiece MISC 1 Units by Does not apply route every 4 (four) hours as needed (wheezing).  . tamsulosin (FLOMAX) 0.4 MG CAPS capsule Take 1 capsule (0.4 mg total) by mouth as needed.  Grant Ruts INHUB 250-50 MCG/DOSE AEPB TAKE 1 PUFF BY MOUTH TWICE A DAY   No facility-administered medications prior to visit.    Review of Systems  Constitutional: Negative for appetite change, chills and fever.  Respiratory: Negative for chest tightness, shortness of breath and wheezing.   Cardiovascular: Negative for  chest pain and palpitations.  Gastrointestinal: Negative for abdominal pain, nausea and vomiting.  Skin:       Discoloration of the great toenails      Objective    BP (!) 126/58 (BP Location: Left Arm, Patient Position: Sitting, Cuff Size: Large)   Pulse (!) 55   Temp 97.8 F (36.6 C) (Oral)   Resp 16   Wt 228 lb (103.4 kg)   SpO2 98%   BMI 32.71 kg/m    Physical Exam     No results found for any visits on 05/05/20.  Assessment & Plan     1. Onychomycosis  - terbinafine (LAMISIL) 250 MG tablet; Take 1 tablet (250 mg total) by mouth daily.  Dispense: 30 tablet; Refill: 1 - Comprehensive metabolic panel; Future (check in 3-4 weeks)  2. Essential hypertension  No follow-ups on file.      The entirety of the information documented in the History of Present Illness, Review of Systems and Physical Exam were personally obtained by me. Portions of this information were initially documented by the CMA and reviewed by me for thoroughness and accuracy.      Lelon Huh, MD  [facility] [phone] (phone) [phone] (fax)  [place]

## 2020-05-05 ENCOUNTER — Ambulatory Visit (INDEPENDENT_AMBULATORY_CARE_PROVIDER_SITE_OTHER): Payer: Medicare Other | Admitting: Family Medicine

## 2020-05-05 ENCOUNTER — Other Ambulatory Visit: Payer: Self-pay

## 2020-05-05 ENCOUNTER — Encounter: Payer: Self-pay | Admitting: Family Medicine

## 2020-05-05 VITALS — BP 126/58 | HR 55 | Temp 97.8°F | Resp 16 | Wt 228.0 lb

## 2020-05-05 DIAGNOSIS — B351 Tinea unguium: Secondary | ICD-10-CM | POA: Diagnosis not present

## 2020-05-05 DIAGNOSIS — I10 Essential (primary) hypertension: Secondary | ICD-10-CM | POA: Diagnosis not present

## 2020-05-05 MED ORDER — TERBINAFINE HCL 250 MG PO TABS: 250.0000 mg | ORAL_TABLET | Freq: Every day | ORAL | 1 refills | Status: DC

## 2020-05-05 NOTE — Patient Instructions (Addendum)
.   You will take terbinafine for 3-6 months to clear up the toenails   We need to check your liver functions the week before labor day to make sure it's safe for you to take terbinafine for an extended period of time. You do not need an appointment and do not need to fast.

## 2020-05-29 DIAGNOSIS — H353221 Exudative age-related macular degeneration, left eye, with active choroidal neovascularization: Secondary | ICD-10-CM | POA: Diagnosis not present

## 2020-06-01 ENCOUNTER — Telehealth: Payer: Self-pay | Admitting: Family Medicine

## 2020-06-01 DIAGNOSIS — B351 Tinea unguium: Secondary | ICD-10-CM

## 2020-06-01 NOTE — Telephone Encounter (Signed)
Please advise patient it is time to check liver functions to make sure he can keep taking Lamisil for toenail infection. Future order has been placed.

## 2020-06-02 DIAGNOSIS — B351 Tinea unguium: Secondary | ICD-10-CM | POA: Diagnosis not present

## 2020-06-02 NOTE — Telephone Encounter (Signed)
Patient advised and agrees to have labs done today. Lab slip placed up front at [address].

## 2020-06-02 NOTE — Telephone Encounter (Signed)
Tried calling patient. No answer. Unable to leave message due to voice mailbox not set up yet.

## 2020-06-02 NOTE — Telephone Encounter (Signed)
Patient returned call to [facility]

## 2020-06-03 LAB — HEPATIC FUNCTION PANEL
ALT: 12 IU/L (ref 0–44)
AST: 17 IU/L (ref 0–40)
Albumin: 4 g/dL (ref 3.6–4.6)
Alkaline Phosphatase: 76 IU/L (ref 48–121)
Bilirubin Total: 0.5 mg/dL (ref 0.0–1.2)
Bilirubin, Direct: 0.15 mg/dL (ref 0.00–0.40)
Total Protein: 6.3 g/dL (ref 6.0–8.5)

## 2020-06-09 ENCOUNTER — Telehealth: Payer: Self-pay

## 2020-06-09 NOTE — Telephone Encounter (Signed)
-----   Message from Birdie Sons, MD sent at 06/06/2020  5:49 PM EDT ----- Liver functions are normal. He can continue taking Lamisil for toenails. Will take 4-6 months to clear them up.

## 2020-06-09 NOTE — Telephone Encounter (Signed)
Patient advised of lab results

## 2020-07-07 ENCOUNTER — Other Ambulatory Visit: Payer: Self-pay | Admitting: Family Medicine

## 2020-07-07 DIAGNOSIS — B351 Tinea unguium: Secondary | ICD-10-CM

## 2020-08-04 ENCOUNTER — Ambulatory Visit: Payer: Self-pay | Admitting: Family Medicine

## 2020-08-04 NOTE — Progress Notes (Signed)
Established patient visit   Patient: Ryan Bentley   DOB: 1931-11-25   86 y.o. Male  MRN: [id number] Visit Date: 08/07/2020  Today's healthcare provider: Lelon Huh, MD   Chief Complaint  Patient presents with  . COPD  . Hypertension   Subjective    HPI  Hypertension, follow-up  BP Readings from Last 3 Encounters:  08/07/20 137/73  05/05/20 (!) 126/58  02/01/20 (!) 122/54   Wt Readings from Last 3 Encounters:  08/07/20 232 lb (105.2 kg)  05/05/20 228 lb (103.4 kg)  02/01/20 234 lb (106.1 kg)     He was last seen for hypertension 6 months ago.  BP at that visit was 122/54. Management since that visit includes continue same medication.  He reports good compliance with treatment. He is not having side effects.  He is following a Regular diet. He is not exercising. He does not smoke.  Use of agents associated with hypertension: none.   Outside blood pressures are not checked. Symptoms: No chest pain No chest pressure  No palpitations No syncope  No dyspnea No orthopnea  No paroxysmal nocturnal dyspnea No lower extremity edema   Pertinent labs: Lab Results  Component Value Date   NA 142 10/29/2019   K 3.7 10/29/2019   CREATININE 1.06 10/29/2019   GFRNONAA 64 10/29/2019   GFRAA 74 10/29/2019   GLUCOSE 82 10/29/2019     The ASCVD Risk score (Goff DC Jr., et al., 2013) failed to calculate for the following reasons:   The 2013 ASCVD risk score is only valid for ages 72 to 57   ---------------------------------------------------------------------------------------------------  Follow up for COPD:  The patient was last seen for this 6 months ago. Changes made at last visit include none; stable on daily use of Wixela inhaler. Rarely uses of Albuterol rescue inhaler  He reports good compliance with treatment. He feels that condition is stable. He is not having side effects.    ----------------------------------------------------------------------------------------- Follow up onychomycosis Was started on terbinafine in early August. He feels his nails have greatly improved. Had normal follow up liver enzymes and is having no adverse affect from medication.   He also reports he tripped over floor exhibit at [place] a few months and landed on the left side of his chest. It was very painful for several weeks, and is still sore, but greatly improved. No dyspnea.      Medications: Outpatient Medications Prior to Visit  Medication Sig  . albuterol (PROVENTIL HFA;VENTOLIN HFA) 108 (90 Base) MCG/ACT inhaler Inhale 2 puffs into the lungs every 6 (six) hours as needed for wheezing or shortness of breath.  Marland Kitchen amLODipine (NORVASC) 5 MG tablet TAKE 1 TABLET BY MOUTH EVERY DAY  . hydrochlorothiazide (HYDRODIURIL) 25 MG tablet TAKE 1 TABLET BY MOUTH EVERY DAY  . Multiple Vitamins-Minerals (PRESERVISION AREDS 2) CAPS Take 1 capsule by mouth 2 (two) times a day.   . Omega-3 Fatty Acids (FISH OIL PO) Take 1 capsule by mouth daily.  Marland Kitchen Spacer/Aero Chamber Mouthpiece MISC 1 Units by Does not apply route every 4 (four) hours as needed (wheezing).  . tamsulosin (FLOMAX) 0.4 MG CAPS capsule Take 1 capsule (0.4 mg total) by mouth as needed.  . terbinafine (LAMISIL) 250 MG tablet TAKE 1 TABLET BY MOUTH EVERY DAY  . WIXELA INHUB 250-50 MCG/DOSE AEPB TAKE 1 PUFF BY MOUTH TWICE A DAY   No facility-administered medications prior to visit.    Review of Systems  Constitutional: Negative for appetite change,  chills and fever.  Respiratory: Negative for chest tightness, shortness of breath and wheezing.   Cardiovascular: Negative for chest pain and palpitations.  Gastrointestinal: Negative for abdominal pain, nausea and vomiting.      Objective    BP 137/73 (BP Location: Left Arm, Patient Position: Sitting, Cuff Size: Normal)   Pulse (!) 49   Temp 97.8 F (36.6 C) (Oral)   Resp 16    Wt 232 lb (105.2 kg)   BMI 33.29 kg/m    Physical Exam   General: Appearance:    Mildly obese male in no acute distress  Eyes:    PERRL, conjunctiva/corneas clear, EOM's intact       Lungs:     Clear to auscultation bilaterally, respirations unlabored  Heart:    Bradycardic. Normal rhythm. No murmurs, rubs, or gallops.   MS:   Mild tenderness over left anterior and lateral chest wall.    Neurologic:   Awake, alert, oriented x 3. No apparent focal neurological           defect.          Assessment & Plan     1. Chronic obstructive pulmonary disease, unspecified COPD type ([place]) Doing very well with maintenance inhaler and rarely requires rescue inhaler.   2. Essential hypertension Well controlled.  Continue current medications.    3. Class 1 obesity without serious comorbidity with body mass index (BMI) of 33.0 to 33.9 in adult, unspecified obesity type Encourage regular exercise and prudent diet.   4. Onychomycosis Much improved with daily terbinafine and tolerating without adverse effects. He is going to finish the current months supply then d/c.    Follow up in 3 months for BP check and labs.       The entirety of the information documented in the History of Present Illness, Review of Systems and Physical Exam were personally obtained by me. Portions of this information were initially documented by the CMA and reviewed by me for thoroughness and accuracy.  Lelon Huh, MD  [facility] [phone] (phone) [phone] (fax)  [place]

## 2020-08-07 ENCOUNTER — Other Ambulatory Visit: Payer: Self-pay

## 2020-08-07 ENCOUNTER — Encounter: Payer: Self-pay | Admitting: Family Medicine

## 2020-08-07 ENCOUNTER — Ambulatory Visit (INDEPENDENT_AMBULATORY_CARE_PROVIDER_SITE_OTHER): Payer: Medicare Other | Admitting: Family Medicine

## 2020-08-07 VITALS — BP 137/73 | HR 49 | Temp 97.8°F | Resp 16 | Wt 232.0 lb

## 2020-08-07 DIAGNOSIS — J449 Chronic obstructive pulmonary disease, unspecified: Secondary | ICD-10-CM | POA: Diagnosis not present

## 2020-08-07 DIAGNOSIS — E669 Obesity, unspecified: Secondary | ICD-10-CM | POA: Diagnosis not present

## 2020-08-07 DIAGNOSIS — I10 Essential (primary) hypertension: Secondary | ICD-10-CM | POA: Diagnosis not present

## 2020-08-07 DIAGNOSIS — Z6833 Body mass index (BMI) 33.0-33.9, adult: Secondary | ICD-10-CM

## 2020-08-07 DIAGNOSIS — B351 Tinea unguium: Secondary | ICD-10-CM | POA: Diagnosis not present

## 2020-08-07 NOTE — Patient Instructions (Signed)
.   Please review the attached list of medications and notify my office if there are any errors.   . Continue Lamisil for one more month. Then you can stop if your nails have completely cleared.

## 2020-08-11 ENCOUNTER — Other Ambulatory Visit: Payer: Self-pay | Admitting: Family Medicine

## 2020-08-11 DIAGNOSIS — I10 Essential (primary) hypertension: Secondary | ICD-10-CM

## 2020-08-11 DIAGNOSIS — N400 Enlarged prostate without lower urinary tract symptoms: Secondary | ICD-10-CM

## 2020-08-11 NOTE — Telephone Encounter (Signed)
Requested Prescriptions  Pending Prescriptions Disp Refills  . amLODipine (NORVASC) 5 MG tablet [Pharmacy Med Name: AMLODIPINE BESYLATE 5 MG TAB] 90 tablet 1    Sig: TAKE 1 TABLET BY MOUTH EVERY DAY     Cardiovascular:  Calcium Channel Blockers Passed - 08/11/2020 10:43 AM      Passed - Last BP in normal range    BP Readings from Last 1 Encounters:  08/07/20 137/73         Passed - Valid encounter within last 6 months    Recent Outpatient Visits          4 days ago Chronic obstructive pulmonary disease, unspecified COPD type [facility])   [facility] Birdie Sons, MD   3 months ago Onychomycosis   [facility] Birdie Sons, MD   6 months ago Essential hypertension   [facility] Birdie Sons, MD   9 months ago Essential hypertension   [facility] Birdie Sons, MD   1 year ago Essential hypertension   [place], Kirstie Peri, MD      Future Appointments            In 3 months Fisher, Kirstie Peri, MD [facility]           . tamsulosin (FLOMAX) 0.4 MG CAPS capsule [Pharmacy Med Name: TAMSULOSIN HCL 0.4 MG CAPSULE] 90 capsule 1    Sig: TAKE 1 CAPSULE (0.4 MG TOTAL) BY MOUTH AS NEEDED.     Urology: Alpha-Adrenergic Blocker Passed - 08/11/2020 10:43 AM      Passed - Last BP in normal range    BP Readings from Last 1 Encounters:  08/07/20 137/73         Passed - Valid encounter within last 12 months    Recent Outpatient Visits          4 days ago Chronic obstructive pulmonary disease, unspecified COPD type [facility])   [facility] Birdie [facility], MD   3 months ago Onychomycosis   [facility] Birdie Sons, MD   6 months ago Essential hypertension   [facility] Sons, MD   9 months ago Essential hypertension   [facility] Birdie Sons, MD   1 year ago Essential hypertension   [facility] Birdie Sons, MD      Future Appointments            In 3 months Fisher, Kirstie Peri, MD [facility]           . hydrochlorothiazide (HYDRODIURIL) 25 MG tablet [Pharmacy Med Name: HYDROCHLOROTHIAZIDE 25 MG TAB] 90 tablet 1    Sig: TAKE 1 TABLET BY MOUTH EVERY DAY     Cardiovascular: Diuretics - Thiazide Passed - 08/11/2020 10:43 AM      Passed - Ca in normal range and within 360 days    Calcium  Date Value Ref Range Status  10/29/2019 9.6 8.6 - 10.2 mg/dL Final         Passed - Cr in normal range and within 360 days    Creatinine, Ser  Date Value Ref Range Status  10/29/2019 1.06 0.76 - 1.27 mg/dL Final         Passed - K in normal range and within 360 days    Potassium  Date Value Ref Range Status  10/29/2019 3.7 3.5 - 5.2 mmol/L Final  Passed - Na in normal range and within 360 days    Sodium  Date Value Ref Range Status  10/29/2019 142 134 - 144 mmol/L Final         Passed - Last BP in normal range    BP Readings from Last 1 Encounters:  08/07/20 137/73         Passed - Valid encounter within last 6 months    Recent Outpatient Visits          4 days ago Chronic obstructive pulmonary disease, unspecified COPD type [facility])   [facility] Birdie Sons, MD   3 months ago Onychomycosis   [facility] Birdie Sons, MD   6 months ago Essential hypertension   [facility] Birdie Sons, MD   9 months ago Essential hypertension   [facility] Birdie Sons, MD   1 year ago Essential hypertension   [facility] Birdie Sons, MD      Future Appointments            In 3 months Fisher, Kirstie Peri, MD [facility] [place]

## 2020-08-20 ENCOUNTER — Other Ambulatory Visit: Payer: Self-pay | Admitting: Family Medicine

## 2020-08-20 DIAGNOSIS — J449 Chronic obstructive pulmonary disease, unspecified: Secondary | ICD-10-CM

## 2020-09-04 DIAGNOSIS — H353221 Exudative age-related macular degeneration, left eye, with active choroidal neovascularization: Secondary | ICD-10-CM | POA: Diagnosis not present

## 2020-11-08 DIAGNOSIS — H353221 Exudative age-related macular degeneration, left eye, with active choroidal neovascularization: Secondary | ICD-10-CM | POA: Diagnosis not present

## 2020-11-14 ENCOUNTER — Ambulatory Visit: Payer: Self-pay | Admitting: Family Medicine

## 2020-11-14 NOTE — Progress Notes (Deleted)
      Established patient visit   Patient: Ryan Bentley   DOB: 13-Feb-1932   87 y.o. Male  MRN: [id number] Visit Date: 11/14/2020  Today's healthcare provider: Lelon Huh, MD   No chief complaint on file.  Subjective    HPI  Hypertension, follow-up  BP Readings from Last 3 Encounters:  08/07/20 137/73  05/05/20 (!) 126/58  02/01/20 (!) 122/54   Wt Readings from Last 3 Encounters:  08/07/20 232 lb (105.2 kg)  05/05/20 228 lb (103.4 kg)  02/01/20 234 lb (106.1 kg)     He was last seen for hypertension 3 months ago.  BP at that visit was 137/73. Management since that visit includes continue current medications.  He reports {excellent/good/fair/poor:19665} compliance with treatment. He {is/is not:9024} having side effects. {document side effects if present:1} He is following a {diet:21022986} diet. He {is/is not:9024} exercising. He {does/does not:200015} smoke.  Use of agents associated with hypertension: {bp agents assoc with hypertension:511::"none"}.   Outside blood pressures are {***enter patient reported home BP readings, or 'not being checked':1}. Symptoms: {Yes/No:20286} chest pain {Yes/No:20286} chest pressure  {Yes/No:20286} palpitations {Yes/No:20286} syncope  {Yes/No:20286} dyspnea {Yes/No:20286} orthopnea  {Yes/No:20286} paroxysmal nocturnal dyspnea {Yes/No:20286} lower extremity edema   Pertinent labs: No results found for: CHOL, HDL, LDLCALC, LDLDIRECT, TRIG, CHOLHDL Lab Results  Component Value Date   NA 142 10/29/2019   K 3.7 10/29/2019   CREATININE 1.06 10/29/2019   GFRNONAA 64 10/29/2019   GFRAA 74 10/29/2019   GLUCOSE 82 10/29/2019     The ASCVD Risk score (Goff DC Jr., et al., 2013) failed to calculate for the following reasons:   The 2013 ASCVD risk score is only valid for ages 91 to 13   ---------------------------------------------------------------------------------------------------   {Show patient history (optional):23778::"  "}   Medications: Outpatient Medications Prior to Visit  Medication Sig  . albuterol (PROVENTIL HFA;VENTOLIN HFA) 108 (90 Base) MCG/ACT inhaler Inhale 2 puffs into the lungs every 6 (six) hours as needed for wheezing or shortness of breath.  Marland Kitchen amLODipine (NORVASC) 5 MG tablet TAKE 1 TABLET BY MOUTH EVERY DAY  . hydrochlorothiazide (HYDRODIURIL) 25 MG tablet TAKE 1 TABLET BY MOUTH EVERY DAY  . Multiple Vitamins-Minerals (PRESERVISION AREDS 2) CAPS Take 1 capsule by mouth 2 (two) times a day.   . Omega-3 Fatty Acids (FISH OIL PO) Take 1 capsule by mouth daily.  Marland Kitchen Spacer/Aero Chamber Mouthpiece MISC 1 Units by Does not apply route every 4 (four) hours as needed (wheezing).  . tamsulosin (FLOMAX) 0.4 MG CAPS capsule TAKE 1 CAPSULE (0.4 MG TOTAL) BY MOUTH AS NEEDED.  Marland Kitchen terbinafine (LAMISIL) 250 MG tablet TAKE 1 TABLET BY MOUTH EVERY DAY  . WIXELA INHUB 250-50 MCG/DOSE AEPB INHALE 1 PUFF BY MOUTH TWICE A DAY   No facility-administered medications prior to visit.    Review of Systems  {Labs  Heme  Chem  Endocrine  Serology  Results Review (optional):23779::" "}   Objective    There were no vitals taken for this visit. {Show previous vital signs (optional):23777::" "}   Physical Exam  ***  No results found for any visits on 11/14/20.  Assessment & Plan     ***  No follow-ups on file.      {provider attestation***:1}   Lelon Huh, MD  [facility] [phone] (phone) [phone] (fax)  [place]

## 2020-11-15 NOTE — Patient Instructions (Signed)
.   Please review the attached list of medications and notify my office if there are any errors.   . Please bring all of your medications to every appointment so we can make sure that our medication list is the same as yours.   

## 2020-11-21 ENCOUNTER — Ambulatory Visit (INDEPENDENT_AMBULATORY_CARE_PROVIDER_SITE_OTHER): Payer: Medicare Other | Admitting: Family Medicine

## 2020-11-21 ENCOUNTER — Other Ambulatory Visit: Payer: Self-pay

## 2020-11-21 ENCOUNTER — Encounter: Payer: Self-pay | Admitting: Family Medicine

## 2020-11-21 VITALS — BP 136/51 | HR 49 | Temp 97.9°F | Resp 20 | Ht 69.5 in | Wt 226.0 lb

## 2020-11-21 DIAGNOSIS — E041 Nontoxic single thyroid nodule: Secondary | ICD-10-CM

## 2020-11-21 DIAGNOSIS — J449 Chronic obstructive pulmonary disease, unspecified: Secondary | ICD-10-CM

## 2020-11-21 DIAGNOSIS — I10 Essential (primary) hypertension: Secondary | ICD-10-CM | POA: Diagnosis not present

## 2020-11-21 MED ORDER — ALBUTEROL SULFATE HFA 108 (90 BASE) MCG/ACT IN AERS: 2.0000 | INHALATION_SPRAY | Freq: Four times a day (QID) | RESPIRATORY_TRACT | 2 refills | Status: AC | PRN

## 2020-11-21 NOTE — Progress Notes (Signed)
Established patient visit   Patient: Ryan Bentley   DOB: 19-Oct-1931   87 y.o. Male  MRN: [id number] Visit Date: 11/21/2020  Today's healthcare provider: Lelon Huh, MD   Chief Complaint  Patient presents with  . Hypertension  . COPD   Subjective    HPI  Hypertension, follow-up  BP Readings from Last 3 Encounters:  11/21/20 (!) 136/51  08/07/20 137/73  05/05/20 (!) 126/58   Wt Readings from Last 3 Encounters:  11/21/20 226 lb (102.5 kg)  08/07/20 232 lb (105.2 kg)  05/05/20 228 lb (103.4 kg)     He was last seen for hypertension 3 months ago.  BP at that visit was 137/73. Management since that visit includes no changes in medication.  He reports good compliance with treatment. He is not having side effects.  He is following a Regular diet. He is exercising. Patient reports that he exercises about 5 days weekly. He has lost 6lbs since last visit.  Recently purchased a stationary bike.  He does not smoke.  Use of agents associated with hypertension: none.   Outside blood pressures are not being checked. Symptoms: No chest pain No chest pressure  No palpitations No syncope  No dyspnea No orthopnea  No paroxysmal nocturnal dyspnea No lower extremity edema   Pertinent labs: No results found for: CHOL, HDL, LDLCALC, LDLDIRECT, TRIG, CHOLHDL Lab Results  Component Value Date   NA 142 10/29/2019   K 3.7 10/29/2019   CREATININE 1.06 10/29/2019   GFRNONAA 64 10/29/2019   GFRAA 74 10/29/2019   GLUCOSE 82 10/29/2019     The ASCVD Risk score (Goff DC Jr., et al., 2013) failed to calculate for the following reasons:   The 2013 ASCVD risk score is only valid for ages 90 to 62    COPD, Follow up  He was last seen for this 3 months ago. Changes made include no medication changes.   He reports good compliance with treatment. He is not having side effects.  he uses rescue inhaler 1 per months.  he reports breathing is Unchanged.      Medications: Outpatient Medications Prior to Visit  Medication Sig  . albuterol (PROVENTIL HFA;VENTOLIN HFA) 108 (90 Base) MCG/ACT inhaler Inhale 2 puffs into the lungs every 6 (six) hours as needed for wheezing or shortness of breath.  Marland Kitchen amLODipine (NORVASC) 5 MG tablet TAKE 1 TABLET BY MOUTH EVERY DAY  . hydrochlorothiazide (HYDRODIURIL) 25 MG tablet TAKE 1 TABLET BY MOUTH EVERY DAY  . Multiple Vitamins-Minerals (PRESERVISION AREDS 2) CAPS Take 1 capsule by mouth 2 (two) times a day.   . Omega-3 Fatty Acids (FISH OIL PO) Take 1 capsule by mouth daily.  Marland Kitchen Spacer/Aero Chamber Mouthpiece MISC 1 Units by Does not apply route every 4 (four) hours as needed (wheezing).  . tamsulosin (FLOMAX) 0.4 MG CAPS capsule TAKE 1 CAPSULE (0.4 MG TOTAL) BY MOUTH AS NEEDED.  Marland Kitchen WIXELA INHUB 250-50 MCG/DOSE AEPB INHALE 1 PUFF BY MOUTH TWICE A DAY  . terbinafine (LAMISIL) 250 MG tablet TAKE 1 TABLET BY MOUTH EVERY DAY (Patient not taking: Reported on 11/21/2020)   No facility-administered medications prior to visit.    Review of Systems  Constitutional: Negative.   Respiratory: Negative.   Cardiovascular: Negative.   Musculoskeletal: Negative.   Neurological: Negative.   Psychiatric/Behavioral: Negative.        Objective    BP (!) 136/51   Pulse (!) 49   Temp 97.9 F (36.6  C)   Resp 20   Ht 5' 9.5" (1.765 m)   Wt 226 lb (102.5 kg)   BMI 32.90 kg/m     Physical Exam   General: Appearance:    Obese male in no acute distress  Eyes:    PERRL, conjunctiva/corneas clear, EOM's intact       Lungs:     Clear to auscultation bilaterally, respirations unlabored  Heart:    Bradycardic. Normal rhythm. No murmurs, rubs, or gallops.   MS:   All extremities are intact.   Neurologic:   Awake, alert, oriented x 3. No apparent focal neurological           defect.          Assessment & Plan     1. Essential hypertension Well controlled.  Continue current medications.  Continue regular exercise  routine.  - CBC - Comprehensive metabolic panel - TSH  2. Chronic obstructive pulmonary disease, unspecified COPD type ([place]) Doing well on maintenance inhaler with rare use of rescue inhaler, which is due for refill today.  - albuterol (VENTOLIN HFA) 108 (90 Base) MCG/ACT inhaler; Inhale 2 puffs into the lungs every 6 (six) hours as needed for wheezing or shortness of breath.  Dispense: 1 each; Refill: 2  3. Thyroid nodule  - TSH        The entirety of the information documented in the History of Present Illness, Review of Systems and Physical Exam were personally obtained by me. Portions of this information were initially documented by the CMA and reviewed by me for thoroughness and accuracy.      Lelon Huh, MD  [facility] [phone] (phone) [phone] (fax)  [place]

## 2020-11-22 LAB — COMPREHENSIVE METABOLIC PANEL
ALT: 11 IU/L (ref 0–44)
AST: 14 IU/L (ref 0–40)
Albumin/Globulin Ratio: 2.1 (ref 1.2–2.2)
Albumin: 4.2 g/dL (ref 3.6–4.6)
Alkaline Phosphatase: 89 IU/L (ref 44–121)
BUN/Creatinine Ratio: 15 (ref 10–24)
BUN: 17 mg/dL (ref 8–27)
Bilirubin Total: 0.6 mg/dL (ref 0.0–1.2)
CO2: 20 mmol/L (ref 20–29)
Calcium: 9.8 mg/dL (ref 8.6–10.2)
Chloride: 108 mmol/L — ABNORMAL HIGH (ref 96–106)
Creatinine, Ser: 1.16 mg/dL (ref 0.76–1.27)
GFR calc Af Amer: 65 mL/min/{1.73_m2} (ref >59)
GFR calc non Af Amer: 56 mL/min/{1.73_m2} — ABNORMAL LOW (ref >59)
Globulin, Total: 2 g/dL (ref 1.5–4.5)
Glucose: 91 mg/dL (ref 65–99)
Potassium: 4.2 mmol/L (ref 3.5–5.2)
Sodium: 143 mmol/L (ref 134–144)
Total Protein: 6.2 g/dL (ref 6.0–8.5)

## 2020-11-22 LAB — CBC
Hematocrit: 41.3 % (ref 37.5–51.0)
Hemoglobin: 14.1 g/dL (ref 13.0–17.7)
MCH: 28.3 pg (ref 26.6–33.0)
MCHC: 34.1 g/dL (ref 31.5–35.7)
MCV: 83 fL (ref 79–97)
Platelets: 235 10*3/uL (ref 150–450)
RBC: 4.99 x10E6/uL (ref 4.14–5.80)
RDW: 13.3 % (ref 11.6–15.4)
WBC: 7.9 10*3/uL (ref 3.4–10.8)

## 2020-11-22 LAB — TSH: TSH: 1.31 u[IU]/mL (ref 0.450–4.500)

## 2020-11-28 ENCOUNTER — Telehealth: Payer: Self-pay | Admitting: Family Medicine

## 2020-11-28 NOTE — Telephone Encounter (Signed)
Copied from [place] [id number]. Topic: Medicare AWV >> Nov 28, 2020  1:47 PM Cher Nakai R wrote: Reason for CRM:  Left message for patient to call back and schedule Medicare Annual Wellness Visit (AWV) in office.   If not able to come in office, please offer to do virtually or by telephone.   Last AWV: 11/23/2019  Please schedule at anytime with [facility] Health Advisor.  If any questions, please contact me at [phone]

## 2020-12-05 NOTE — Progress Notes (Signed)
Subjective:   Ryan Bentley is a 87 y.o. male who presents for Medicare Annual/Subsequent preventive examination.  I connected with Ryan Bentley today by telephone and verified that I am speaking with the correct person using two identifiers. Location patient: home Location provider: work Persons participating in the virtual visit: patient, provider.   I discussed the limitations, risks, security and privacy concerns of performing an evaluation and management service by telephone and the availability of in person appointments. I also discussed with the patient that there may be a patient responsible charge related to this service. The patient expressed understanding and verbally consented to this telephonic visit.    Interactive audio and video telecommunications were attempted between this provider and patient, however failed, due to patient having technical difficulties OR patient did not have access to video capability.  We continued and completed visit with audio only.    Review of Systems    N/A  Cardiac Risk Factors include: advanced age (>41men, >46 women);male gender;hypertension     Objective:    There were no vitals filed for this visit. There is no height or weight on file to calculate BMI.  Advanced Directives 12/06/2020 11/23/2019 01/09/2019 01/08/2019 11/20/2018 11/13/2017 10/22/2016  Does Patient Have a Medical Advance Directive? Yes Yes Yes Yes Yes Yes Yes  Type of Paramedic of [place];Living will [place];Living will [place];Out of facility DNR (pink MOST or yellow form) [place];Out of facility DNR (pink MOST or yellow form);Living will [place];Living will [place];Living will Living will;Healthcare Power of Attorney  Does patient want to make changes to medical advance directive? - - - - - - -  Copy of [place] in Chart?  No - copy requested No - copy requested - No - copy requested No - copy requested No - copy requested Yes  Would patient like information on creating a medical advance directive? - - - - - - -    Current Medications (verified) Outpatient Encounter Medications as of 12/06/2020  Medication Sig  . albuterol (VENTOLIN HFA) 108 (90 Base) MCG/ACT inhaler Inhale 2 puffs into the lungs every 6 (six) hours as needed for wheezing or shortness of breath.  Marland Kitchen amLODipine (NORVASC) 5 MG tablet TAKE 1 TABLET BY MOUTH EVERY DAY  . hydrochlorothiazide (HYDRODIURIL) 25 MG tablet TAKE 1 TABLET BY MOUTH EVERY DAY  . Multiple Vitamins-Minerals (PRESERVISION AREDS 2) CAPS Take 1 capsule by mouth 2 (two) times a day.   . Omega-3 Fatty Acids (FISH OIL PO) Take 1 capsule by mouth daily.  Marland Kitchen Spacer/Aero Chamber Mouthpiece MISC 1 Units by Does not apply route every 4 (four) hours as needed (wheezing).  . tamsulosin (FLOMAX) 0.4 MG CAPS capsule TAKE 1 CAPSULE (0.4 MG TOTAL) BY MOUTH AS NEEDED.  Marland Kitchen WIXELA INHUB 250-50 MCG/DOSE AEPB INHALE 1 PUFF BY MOUTH TWICE A DAY   No facility-administered encounter medications on file as of 12/06/2020.    Allergies (verified) Iodine and Ivp dye [iodinated diagnostic agents]   History: Past Medical History:  Diagnosis Date  . Arthritis   . Asthma   . C. difficile colitis 08/23/2016  . History of colon polyps    All benign per patient report  . History of echocardiogram    a. 03/2015: a. EF 55-60%, no WMA, nl diastolic fxn, trivial TR, nl PASP  . Hypertension   . Macular degeneration   . Migraine headache  Past Surgical History:  Procedure Laterality Date  . APPENDECTOMY    . CATARACT EXTRACTION     x2  . DENTAL SURGERY  10/2010  . PHOTODYNAMIC THERAPY    . TONSILLECTOMY AND ADENOIDECTOMY  1940's   Family History  Problem Relation Age of Onset  . Heart disease Mother    Social History   Socioeconomic History  . Marital status: Widowed    Spouse name: Not on file   . Number of children: 2  . Years of education: Not on file  . Highest education level: Bachelor's degree (e.g., BA, AB, BS)  Occupational History  . Occupation: retired  Tobacco Use  . Smoking status: Former Smoker    Types: Cigarettes    Quit date: 09/30/1966    Years since quitting: 54.2  . Smokeless tobacco: Never Used  Vaping Use  . Vaping Use: Never used  Substance and Sexual Activity  . Alcohol use: Yes    Alcohol/week: 0.0 - 8.0 standard drinks    Comment: has a drink a night x 4 days  . Drug use: No  . Sexual activity: Never  Other Topics Concern  . Not on file  Social History Narrative  . Not on file   Social Determinants of Health   Financial Resource Strain: Low Risk   . Difficulty of Paying Living Expenses: Not hard at all  Food Insecurity: No Food Insecurity  . Worried About Charity fundraiser in the Last Year: Never true  . Ran Out of Food in the Last Year: Never true  Transportation Needs: No Transportation Needs  . Lack of Transportation (Medical): No  . Lack of Transportation (Non-Medical): No  Physical Activity: Insufficiently Active  . Days of Exercise per Week: 6 days  . Minutes of Exercise per Session: 20 min  Stress: No Stress Concern Present  . Feeling of Stress : Not at all  Social Connections: Moderately Integrated  . Frequency of Communication with Friends and Family: More than three times a week  . Frequency of Social Gatherings with Friends and Family: Twice a week  . Attends Religious Services: More than 4 times per year  . Active Member of Clubs or Organizations: Yes  . Attends Archivist Meetings: More than 4 times per year  . Marital Status: Widowed    Tobacco Counseling Counseling given: Not Answered   Clinical Intake:  Pre-visit preparation completed: Yes  Pain : No/denies pain     Nutritional Risks: None Diabetes: No  How often do you need to have someone help you when you read instructions, pamphlets, or  other written materials from your doctor or pharmacy?: 1 - Never  Diabetic? No  Interpreter Needed?: No  Information entered by :: [facility], LPN   Activities of Daily Living In your present state of health, do you have any difficulty performing the following activities: 12/06/2020  Hearing? N  Vision? 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  Difficulty concentrating or making decisions? N  Walking or climbing stairs? N  Dressing or bathing? N  Doing errands, shopping? N  Preparing Food and eating ? N  Using the Toilet? N  In the past six months, have you accidently leaked urine? N  Do you have problems with loss of bowel control? N  Managing your Medications? N  Managing your Finances? N  Housekeeping or managing your Housekeeping? N  Some recent data might be hidden    Patient Care Team: Birdie Sons, MD  as PCP - General (Family Medicine) Isaias Sakai, MD as Referring Physician (Ophthalmology) Dingeldein, Remo Lipps, MD (Ophthalmology)  Indicate any recent Medical Services you may have received from other than [facility] providers in the past year (date may be approximate).     Assessment:   This is a routine wellness examination for [place].  Hearing/Vision screen No exam data present  Dietary issues and exercise activities discussed: Current Exercise Habits: Home exercise routine, Type of exercise: Other - see comments (stationary bike), Time (Minutes): 20 (to 60 minutes), Frequency (Times/Week): 6, Weekly Exercise (Minutes/Week): 120, Intensity: Mild, Exercise limited by: None identified  Goals    . DIET - REDUCE CALORIE INTAKE     Recommend to monitor junk food intake and to limit the amount of salt and sweets in daily diet to help aid in weight loss.     . Increase water intake     Recommend to increase water intake to 6-8 8 oz glasses a day.      Depression Screen PHQ 2/9 Scores 11/21/2020 11/23/2019 11/20/2018 11/13/2017 10/22/2016 12/06/2015  PHQ - 2 Score 0 0 0 0 1 0     Fall Risk Fall Risk  12/06/2020 11/21/2020 11/23/2019 11/20/2018 11/13/2017  Falls in the past year? 0 0 0 0 No  Number falls in past yr: 0 - 0 - -  Comment - - tripped - -  Injury with Fall? 0 - 1 - -  Comment - - fractured knee - -  Follow up - - Falls prevention discussed - -    FALL RISK PREVENTION PERTAINING TO THE HOME:  Any stairs in or around the home? Yes  If so, are there any without handrails? No  Home free of loose throw rugs in walkways, pet beds, electrical cords, etc? Yes  Adequate lighting in your home to reduce risk of falls? Yes   ASSISTIVE DEVICES UTILIZED TO PREVENT FALLS:  Life alert? No  Use of a cane, walker or w/c? Yes  Grab bars in the bathroom? Yes  Shower chair or bench in shower? No  Elevated toilet seat or a handicapped toilet? No    Cognitive Function: Normal cognitive status assessed by direct observation by this Nurse Health Advisor. No abnormalities found.      6CIT Screen 11/23/2019 11/20/2018 10/22/2016  What Year? 0 points 0 points 0 points  What month? 0 points 0 points 0 points  What time? 0 points 0 points 0 points  Count back from 20 0 points 0 points 0 points  Months in reverse 0 points 0 points 0 points  Repeat phrase 0 points 0 points 0 points  Total Score 0 0 0    Immunizations Immunization History  Administered Date(s) Administered  . Fluad Quad(high Dose 65+) 07/14/2020  . Influenza, High Dose Seasonal PF 08/04/2017, 07/30/2018  . Influenza-Unspecified 08/01/2015, 07/26/2016, 07/20/2019  . Moderna Sars-Covid-2 Vaccination 10/12/2019, 11/09/2019, 08/15/2020  . Pneumococcal Conjugate-13 11/30/2014  . Pneumococcal Polysaccharide-23 08/22/2008  . Zoster 12/06/2015    TDAP status: Up to date  Flu Vaccine status: Up to date  Pneumococcal vaccine status: Up to date  Covid-19 vaccine status: Completed vaccines  Qualifies for Shingles Vaccine? Yes   Zostavax completed Yes   Shingrix Completed?: No.    Education has been  provided regarding the importance of this vaccine. Patient has been advised to call insurance company to determine out of pocket expense if they have not yet received this vaccine. Advised may also receive vaccine at local pharmacy or  Health Dept. Verbalized acceptance and understanding.  Screening Tests Health Maintenance  Topic Date Due  . COVID-19 Vaccine (4 - Booster for Moderna series) 02/12/2021  . TETANUS/TDAP  04/27/2023  . INFLUENZA VACCINE  Completed  . PNA vac Low Risk Adult  Completed  . HPV VACCINES  Aged Out    Health Maintenance  There are no preventive care reminders to display for this patient.  Colorectal cancer screening: No longer required.   Lung Cancer Screening: (Low Dose CT Chest recommended if Age 76-80 years, 30 pack-year currently smoking OR have quit w/in 15years.) does not qualify.   Additional Screening:  Vision Screening: Recommended annual ophthalmology exams for early detection of glaucoma and other disorders of the eye. Is the patient up to date with their annual eye exam?  Yes  Who is the provider or what is the name of the office in which the patient attends annual eye exams? Dr Dingledein @ [facility] If pt is not established with a provider, would they like to be referred to a provider to establish care? No .   Dental Screening: Recommended annual dental exams for proper oral hygiene  Community Resource Referral / Chronic Care Management: CRR required this visit?  No   CCM required this visit?  No      Plan:     I have personally reviewed and noted the following in the patient's chart:   . Medical and social history . Use of alcohol, tobacco or illicit drugs  . Current medications and supplements . Functional ability and status . Nutritional status . Physical activity . Advanced directives . List of other physicians . Hospitalizations, surgeries, and ER visits in previous 12 months . Vitals . Screenings to include cognitive,  depression, and falls . Referrals and appointments  In addition, I have reviewed and discussed with patient certain preventive protocols, quality metrics, and best practice recommendations. A written personalized care plan for preventive services as well as general preventive health recommendations were provided to patient.      [place], [place]   02/06/9356   Nurse Notes: None.

## 2020-12-06 ENCOUNTER — Ambulatory Visit (INDEPENDENT_AMBULATORY_CARE_PROVIDER_SITE_OTHER): Payer: Medicare Other

## 2020-12-06 ENCOUNTER — Other Ambulatory Visit: Payer: Self-pay

## 2020-12-06 DIAGNOSIS — Z0000 Encounter for general adult medical examination without abnormal findings: Secondary | ICD-10-CM | POA: Diagnosis not present

## 2020-12-06 NOTE — Patient Instructions (Signed)
[name] , Thank you for taking time to come for your Medicare Wellness Visit. I appreciate your ongoing commitment to your health goals. Please review the following plan we discussed and let me know if I can assist you in the future.   Screening recommendations/referrals: Colonoscopy: No longer required.  Recommended yearly ophthalmology/optometry visit for glaucoma screening and checkup Recommended yearly dental visit for hygiene and checkup  Vaccinations: Influenza vaccine: Done 07/14/20 Pneumococcal vaccine: Completed series Tdap vaccine: Up to date, due 03/2023 Shingles vaccine: Shingrix discussed. Please contact your pharmacy for coverage information.     Advanced directives: Please bring a copy of your POA (Power of Attorney) and/or Living Will to your next appointment.   Conditions/risks identified: Recommend to monitor junk food intake and to limit the amount of salt and sweets in daily diet to help aid in weight loss. Recommend to increase water intake to 6-8 8 oz glasses of water a day.   Next appointment: 02/20/21 @ 2:00 PM with Dr Ryan Bentley. Declined scheduling an AWV for 2023 at this time.   Preventive Care 85 Years and Older, Male Preventive care refers to lifestyle choices and visits with your health care provider that can promote health and wellness. What does preventive care include?  A yearly physical exam. This is also called an annual well check.  Dental exams once or twice a year.  Routine eye exams. Ask your health care provider how often you should have your eyes checked.  Personal lifestyle choices, including:  Daily care of your teeth and gums.  Regular physical activity.  Eating a healthy diet.  Avoiding tobacco and drug use.  Limiting alcohol use.  Practicing safe sex.  Taking low doses of aspirin every day.  Taking vitamin and mineral supplements as recommended by your health care provider. What happens during an annual well check? The  services and screenings done by your health care provider during your annual well check will depend on your age, overall health, lifestyle risk factors, and family history of disease. Counseling  Your health care provider may ask you questions about your:  Alcohol use.  Tobacco use.  Drug use.  Emotional well-being.  Home and relationship well-being.  Sexual activity.  Eating habits.  History of falls.  Memory and ability to understand (cognition).  Work and work Statistician. Screening  You may have the following tests or measurements:  Height, weight, and BMI.  Blood pressure.  Lipid and cholesterol levels. These may be checked every 5 years, or more frequently if you are over 13 years old.  Skin check.  Lung cancer screening. You may have this screening every year starting at age 46 if you have a 30-pack-year history of smoking and currently smoke or have quit within the past 15 years.  Fecal occult blood test (FOBT) of the stool. You may have this test every year starting at age 12.  Flexible sigmoidoscopy or colonoscopy. You may have a sigmoidoscopy every 5 years or a colonoscopy every 10 years starting at age 46.  Prostate cancer screening. Recommendations will vary depending on your family history and other risks.  Hepatitis C blood test.  Hepatitis B blood test.  Sexually transmitted disease (STD) testing.  Diabetes screening. This is done by checking your blood sugar (glucose) after you have not eaten for a while (fasting). You may have this done every 1-3 years.  Abdominal aortic aneurysm (AAA) screening. You may need this if you are a current or former smoker.  Osteoporosis. You  may be screened starting at age 24 if you are at high risk. Talk with your health care provider about your test results, treatment options, and if necessary, the need for more tests. Vaccines  Your health care provider may recommend certain vaccines, such as:  Influenza  vaccine. This is recommended every year.  Tetanus, diphtheria, and acellular pertussis (Tdap, Td) vaccine. You may need a Td booster every 10 years.  Zoster vaccine. You may need this after age 19.  Pneumococcal 13-valent conjugate (PCV13) vaccine. One dose is recommended after age 13.  Pneumococcal polysaccharide (PPSV23) vaccine. One dose is recommended after age 63. Talk to your health care provider about which screenings and vaccines you need and how often you need them. This information is not intended to replace advice given to you by your health care provider. Make sure you discuss any questions you have with your health care provider. Document Released: 10/13/2015 Document Revised: 06/05/2016 Document Reviewed: 07/18/2015 Elsevier Interactive Patient Education  2017 [place] Prevention in the Home Falls can cause injuries. They can happen to people of all ages. There are many things you can do to make your home safe and to help prevent falls. What can I do on the outside of my home?  Regularly fix the edges of walkways and driveways and fix any cracks.  Remove anything that might make you trip as you walk through a door, such as a raised step or threshold.  Trim any bushes or trees on the path to your home.  Use bright outdoor lighting.  Clear any walking paths of anything that might make someone trip, such as rocks or tools.  Regularly check to see if handrails are loose or broken. Make sure that both sides of any steps have handrails.  Any raised decks and porches should have guardrails on the edges.  Have any leaves, snow, or ice cleared regularly.  Use sand or salt on walking paths during winter.  Clean up any spills in your garage right away. This includes oil or grease spills. What can I do in the bathroom?  Use night lights.  Install grab bars by the toilet and in the tub and shower. Do not use towel bars as grab bars.  Use non-skid mats or decals  in the tub or shower.  If you need to sit down in the shower, use a plastic, non-slip stool.  Keep the floor dry. Clean up any water that spills on the floor as soon as it happens.  Remove soap buildup in the tub or shower regularly.  Attach bath mats securely with double-sided non-slip rug tape.  Do not have throw rugs and other things on the floor that can make you trip. What can I do in the bedroom?  Use night lights.  Make sure that you have a light by your bed that is easy to reach.  Do not use any sheets or blankets that are too big for your bed. They should not hang down onto the floor.  Have a firm chair that has side arms. You can use this for support while you get dressed.  Do not have throw rugs and other things on the floor that can make you trip. What can I do in the kitchen?  Clean up any spills right away.  Avoid walking on wet floors.  Keep items that you use a lot in easy-to-reach places.  If you need to reach something above you, use a strong step stool that  has a grab bar.  Keep electrical cords out of the way.  Do not use floor polish or wax that makes floors slippery. If you must use wax, use non-skid floor wax.  Do not have throw rugs and other things on the floor that can make you trip. What can I do with my stairs?  Do not leave any items on the stairs.  Make sure that there are handrails on both sides of the stairs and use them. Fix handrails that are broken or loose. Make sure that handrails are as long as the stairways.  Check any carpeting to make sure that it is firmly attached to the stairs. Fix any carpet that is loose or worn.  Avoid having throw rugs at the top or bottom of the stairs. If you do have throw rugs, attach them to the floor with carpet tape.  Make sure that you have a light switch at the top of the stairs and the bottom of the stairs. If you do not have them, ask someone to add them for you. What else can I do to help  prevent falls?  Wear shoes that:  Do not have high heels.  Have rubber bottoms.  Are comfortable and fit you well.  Are closed at the toe. Do not wear sandals.  If you use a stepladder:  Make sure that it is fully opened. Do not climb a closed stepladder.  Make sure that both sides of the stepladder are locked into place.  Ask someone to hold it for you, if possible.  Clearly mark and make sure that you can see:  Any grab bars or handrails.  First and last steps.  Where the edge of each step is.  Use tools that help you move around (mobility aids) if they are needed. These include:  Canes.  Walkers.  Scooters.  Crutches.  Turn on the lights when you go into a dark area. Replace any light bulbs as soon as they burn out.  Set up your furniture so you have a clear path. Avoid moving your furniture around.  If any of your floors are uneven, fix them.  If there are any pets around you, be aware of where they are.  Review your medicines with your doctor. Some medicines can make you feel dizzy. This can increase your chance of falling. Ask your doctor what other things that you can do to help prevent falls. This information is not intended to replace advice given to you by your health care provider. Make sure you discuss any questions you have with your health care provider. Document Released: 07/13/2009 Document Revised: 02/22/2016 Document Reviewed: 10/21/2014 Elsevier Interactive Patient Education  2017 [employer].

## 2020-12-11 DIAGNOSIS — H353221 Exudative age-related macular degeneration, left eye, with active choroidal neovascularization: Secondary | ICD-10-CM | POA: Diagnosis not present

## 2021-02-04 ENCOUNTER — Other Ambulatory Visit: Payer: Self-pay | Admitting: Family Medicine

## 2021-02-04 DIAGNOSIS — I10 Essential (primary) hypertension: Secondary | ICD-10-CM

## 2021-02-04 NOTE — Telephone Encounter (Signed)
Requested Prescriptions  Pending Prescriptions Disp Refills  . amLODipine (NORVASC) 5 MG tablet [Pharmacy Med Name: AMLODIPINE BESYLATE 5 MG TAB] 90 tablet 1    Sig: TAKE 1 TABLET BY MOUTH EVERY DAY     Cardiovascular:  Calcium Channel Blockers Passed - 02/04/2021  2:13 PM      Passed - Last BP in normal range    BP Readings from Last 1 Encounters:  11/21/20 (!) 136/51         Passed - Valid encounter within last 6 months    Recent Outpatient Visits          2 months ago Essential hypertension   [facility] Birdie Sons, MD   6 months ago Chronic obstructive pulmonary disease, unspecified COPD type [facility])   [facility] Birdie Sons, MD   9 months ago Onychomycosis   [facility] Birdie Sons, MD   1 year ago Essential hypertension   [facility] Birdie Sons, MD   1 year ago Essential hypertension   [facility] Birdie Sons, MD

## 2021-02-14 ENCOUNTER — Other Ambulatory Visit: Payer: Self-pay | Admitting: Family Medicine

## 2021-02-14 DIAGNOSIS — I10 Essential (primary) hypertension: Secondary | ICD-10-CM

## 2021-02-20 ENCOUNTER — Other Ambulatory Visit: Payer: Self-pay

## 2021-02-20 ENCOUNTER — Encounter: Payer: Self-pay | Admitting: Family Medicine

## 2021-02-20 ENCOUNTER — Ambulatory Visit (INDEPENDENT_AMBULATORY_CARE_PROVIDER_SITE_OTHER): Payer: Medicare Other | Admitting: Family Medicine

## 2021-02-20 VITALS — BP 157/71 | HR 45 | Temp 98.2°F | Resp 18 | Ht 70.0 in | Wt 221.0 lb

## 2021-02-20 DIAGNOSIS — I10 Essential (primary) hypertension: Secondary | ICD-10-CM

## 2021-02-20 DIAGNOSIS — R001 Bradycardia, unspecified: Secondary | ICD-10-CM

## 2021-02-20 DIAGNOSIS — I4891 Unspecified atrial fibrillation: Secondary | ICD-10-CM

## 2021-02-20 MED ORDER — ELIQUIS 5 MG PO TABS: 5.0000 mg | ORAL_TABLET | Freq: Two times a day (BID) | ORAL | 0 refills | Status: DC

## 2021-02-20 NOTE — Patient Instructions (Addendum)
Please go to the lab draw station in [address] on the second floor of [facility] . Normal hours are 8:00am to 11:30am and 1:00pm to 4:00pm Monday through Friday  . To prevent a stroke from atrial fibrillation, start taking samples Eliquis 5mg  twice a day.   . I strongly recommend at least one booster dose (a third shot) of the Covid vaccine for all adults. People at high risk for serious Covid infections should have a second booster dose 4-6 months after the first booster.    Atrial Fibrillation  Atrial fibrillation is a type of heartbeat that is irregular or fast. If you have this condition, your heart beats without any order. This makes it hard for your heart to pump blood in a normal way. Atrial fibrillation may come and go, or it may become a long-lasting problem. If this condition is not treated, it can put you at higher risk for stroke, heart failure, and other heart problems. What are the causes? This condition may be caused by diseases that damage the heart. They include:  High blood pressure.  Heart failure.  Heart valve disease.  Heart surgery. Other causes include:  Diabetes.  Thyroid disease.  Being overweight.  Kidney disease. Sometimes the cause is not known. What increases the risk? You are more likely to develop this condition if:  You are older.  You smoke.  You exercise often and very hard.  You have a family history of this condition.  You are a man.  You use drugs.  You drink a lot of alcohol.  You have lung conditions, such as emphysema, pneumonia, or COPD.  You have sleep apnea. What are the signs or symptoms? Common symptoms of this condition include:  A feeling that your heart is beating very fast.  Chest pain or discomfort.  Feeling short of breath.  Suddenly feeling light-headed or weak.  Getting tired easily during activity.  Fainting.  Sweating. In some cases, there are no symptoms. How is this  treated? Treatment for this condition depends on underlying conditions and how you feel when you have atrial fibrillation. They include:  Medicines to: ? Prevent blood clots. ? Treat heart rate or heart rhythm problems.  Using devices, such as a pacemaker, to correct heart rhythm problems.  Doing surgery to remove the part of the heart that sends bad signals.  Closing an area where clots can form in the heart (left atrial appendage). In some cases, your doctor will treat other underlying conditions. Follow these instructions at home: Medicines  Take over-the-counter and prescription medicines only as told by your doctor.  Do not take any new medicines without first talking to your doctor.  If you are taking blood thinners: ? Talk with your doctor before you take any medicines that have aspirin or NSAIDs, such as ibuprofen, in them. ? Take your medicine exactly as told by your doctor. Take it at the same time each day. ? Avoid activities that could hurt or bruise you. Follow instructions about how to prevent falls. ? Wear a bracelet that says you are taking blood thinners. Or, carry a card that lists what medicines you take. Lifestyle  Do not use any products that have nicotine or tobacco in them. These include cigarettes, e-cigarettes, and chewing tobacco. If you need help quitting, ask your doctor.  Eat heart-healthy foods. Talk with your doctor about the right eating plan for you.  Exercise regularly as told by your doctor.  Do not drink alcohol.  Lose weight if you are overweight.  Do not use drugs, including cannabis.      General instructions  If you have a condition that causes breathing to stop for a short period of time (apnea), treat it as told by your doctor.  Keep a healthy weight. Do not use diet pills unless your doctor says they are safe for you. Diet pills may make heart problems worse.  Keep all follow-up visits as told by your doctor. This is  important. Contact a doctor if:  You notice a change in the speed, rhythm, or strength of your heartbeat.  You are taking a blood-thinning medicine and you get more bruising.  You get tired more easily when you move or exercise.  You have a sudden change in weight. Get help right away if:  You have pain in your chest or your belly (abdomen).  You have trouble breathing.  You have side effects of blood thinners, such as blood in your vomit, poop (stool), or pee (urine), or bleeding that cannot stop.  You have any signs of a stroke. "BE FAST" is an easy way to remember the main warning signs: ? B - Balance. Signs are dizziness, sudden trouble walking, or loss of balance. ? E - Eyes. Signs are trouble seeing or a change in how you see. ? F - Face. Signs are sudden weakness or loss of feeling in the face, or the face or eyelid drooping on one side. ? A - Arms. Signs are weakness or loss of feeling in an arm. This happens suddenly and usually on one side of the body. ? S - Speech. Signs are sudden trouble speaking, slurred speech, or trouble understanding what people say. ? T - Time. Time to call emergency services. Write down what time symptoms started.  You have other signs of a stroke, such as: ? A sudden, very bad headache with no known cause. ? Feeling like you may vomit (nausea). ? Vomiting. ? A seizure. These symptoms may be an emergency. Do not wait to see if the symptoms will go away. Get medical help right away. Call your local emergency services (911 in the [place] Do not drive yourself to the hospital.   Summary  Atrial fibrillation is a type of heartbeat that is irregular or fast.  You are at higher risk of this condition if you smoke, are older, have diabetes, or are overweight.  Follow your doctor's instructions about medicines, diet, exercise, and follow-up visits.  Get help right away if you have signs or symptoms of a stroke.  Get help right away if you cannot  catch your breath, or you have chest pain or discomfort. This information is not intended to replace advice given to you by your health care provider. Make sure you discuss any questions you have with your health care provider. Document Revised: 03/10/2019 Document Reviewed: 03/10/2019 Elsevier Patient Education  [place].

## 2021-02-20 NOTE — Progress Notes (Signed)
Established patient visit   Patient: Ryan Bentley   DOB: 29-Jan-1932   86 y.o. Male  MRN: [id number] Visit Date: 02/20/2021  Today's healthcare provider: Lelon Huh, MD   Chief Complaint  Patient presents with  . Hypertension  . COPD   Subjective    HPI  Had AWV with [facility] on 12/06/2020.   Hypertension, follow-up  BP Readings from Last 3 Encounters:  02/20/21 (!) 157/71  11/21/20 (!) 136/51  08/07/20 137/73   Wt Readings from Last 3 Encounters:  02/20/21 221 lb (100.2 kg)  11/21/20 226 lb (102.5 kg)  08/07/20 232 lb (105.2 kg)     He was last seen for hypertension 3 months ago.  BP at that visit was 136/51. Management since that visit includes continuing same medications.  He reports good compliance with treatment. He is not having side effects.  He is following a Regular diet. He is exercising.  He does not smoke.  Use of agents associated with hypertension: none.   Outside blood pressures are not checked. Symptoms: No chest pain No chest pressure  No palpitations No syncope  No dyspnea No orthopnea  No paroxysmal nocturnal dyspnea No lower extremity edema   Pertinent labs: No results found for: CHOL, HDL, LDLCALC, LDLDIRECT, TRIG, CHOLHDL Lab Results  Component Value Date   NA 143 11/21/2020   K 4.2 11/21/2020   CREATININE 1.16 11/21/2020   GFRNONAA 56 (L) 11/21/2020   GFRAA 65 11/21/2020   GLUCOSE 91 11/21/2020     The ASCVD Risk score Mikey Bussing DC Jr., et al., 2013) failed to calculate for the following reasons:   The 2013 ASCVD risk score is only valid for ages 87 to 57   ---------------------------------------------------------------------------------------------------  COPD, Follow up  He was last seen for this 3 months ago. Changes made include none. Continue using maintenance inhaler.   He reports good compliance with treatment. He is not having side effects.  He hasn't used his rescue inhaler in over 5 years. he reports  breathing is stable.  Pulmonary Functions Testing Results:  No results found for: FEV1, FVC, FEV1FVC, TLC  -----------------------------------------------------------------------------------------      Medications: Outpatient Medications Prior to Visit  Medication Sig  . albuterol (VENTOLIN HFA) 108 (90 Base) MCG/ACT inhaler Inhale 2 puffs into the lungs every 6 (six) hours as needed for wheezing or shortness of breath.  Marland Kitchen amLODipine (NORVASC) 5 MG tablet TAKE 1 TABLET BY MOUTH EVERY DAY  . hydrochlorothiazide (HYDRODIURIL) 25 MG tablet TAKE 1 TABLET BY MOUTH EVERY DAY  . Multiple Vitamins-Minerals (PRESERVISION AREDS 2) CAPS Take 1 capsule by mouth 2 (two) times a day.   . Omega-3 Fatty Acids (FISH OIL PO) Take 1 capsule by mouth daily.  Marland Kitchen Spacer/Aero Chamber Mouthpiece MISC 1 Units by Does not apply route every 4 (four) hours as needed (wheezing).  . tamsulosin (FLOMAX) 0.4 MG CAPS capsule TAKE 1 CAPSULE (0.4 MG TOTAL) BY MOUTH AS NEEDED.  Marland Kitchen WIXELA INHUB 250-50 MCG/DOSE AEPB INHALE 1 PUFF BY MOUTH TWICE A DAY   No facility-administered medications prior to visit.    Review of Systems  Constitutional: Negative for appetite change, chills and fever.  Respiratory: Negative for chest tightness, shortness of breath and wheezing.   Cardiovascular: Negative for chest pain and palpitations.  Gastrointestinal: Negative for abdominal pain, nausea and vomiting.    {Labs  Heme  Chem  Endocrine  Serology  Results Review (optional):[id number]::" "}   Objective    BP Marland Kitchen)  157/71 (BP Location: Right Arm, Patient Position: Sitting, Cuff Size: Normal)   Pulse (!) 45   Temp 98.2 F (36.8 C) (Temporal)   Resp 18   Ht 5\' 10"  (1.778 m)   Wt 221 lb (100.2 kg)   BMI 31.71 kg/m    Today's Vitals   02/20/21 1420 02/20/21 1423  BP: (!) 142/59 (!) 157/71  Pulse: (!) 46 (!) 45  Resp: 18   Temp: 98.2 F (36.8 C)   TempSrc: Temporal   Weight: 221 lb (100.2 kg)   Height: 5\' 10"  (1.778 m)     Body mass index is 31.71 kg/m.   Physical Exam   General: Appearance:    Obese male in no acute distress  Eyes:    PERRL, conjunctiva/corneas clear, EOM's intact       Lungs:     Clear to auscultation bilaterally, respirations unlabored  Heart:    Bradycardic.  Irregularly irregular rhythm. Normal rate. No murmurs, rubs, or gallops.   MS:   All extremities are intact.   Neurologic:   Awake, alert, oriented x 3. No apparent focal neurological           defect.        EKG: atrial fibrillation/flutter, sinus bradycardia.   Assessment & Plan     1. Essential hypertension Stable on current medications.   - EKG 12-Lead - Comprehensive metabolic panel - TSH - T4, free - Magnesium  2. Bradycardia   3. Atrial fibrillation, unspecified type ([place]) Newly diagnosed. - apixaban (ELIQUIS) 5 MG TABS tablet; Take 1 tablet (5 mg total) by mouth 2 (two) times daily.  Dispense: 28 tablet; Refill: 0 Advised to avoid aspirin and OTC NSAIDs.   - Ambulatory referral to Cardiology       The entirety of the information documented in the History of Present Illness, Review of Systems and Physical Exam were personally obtained by me. Portions of this information were initially documented by the CMA and reviewed by me for thoroughness and accuracy.      Lelon Huh, MD  [facility] [phone] (phone) [phone] (fax)  [employer]

## 2021-02-21 LAB — COMPREHENSIVE METABOLIC PANEL
ALT: 10 IU/L (ref 0–44)
AST: 13 IU/L (ref 0–40)
Albumin/Globulin Ratio: 1.8 (ref 1.2–2.2)
Albumin: 4.2 g/dL (ref 3.6–4.6)
Alkaline Phosphatase: 82 IU/L (ref 44–121)
BUN/Creatinine Ratio: 13 (ref 10–24)
BUN: 14 mg/dL (ref 8–27)
Bilirubin Total: 0.6 mg/dL (ref 0.0–1.2)
CO2: 21 mmol/L (ref 20–29)
Calcium: 9.6 mg/dL (ref 8.6–10.2)
Chloride: 104 mmol/L (ref 96–106)
Creatinine, Ser: 1.09 mg/dL (ref 0.76–1.27)
Globulin, Total: 2.4 g/dL (ref 1.5–4.5)
Glucose: 98 mg/dL (ref 65–99)
Potassium: 4.1 mmol/L (ref 3.5–5.2)
Sodium: 140 mmol/L (ref 134–144)
Total Protein: 6.6 g/dL (ref 6.0–8.5)
eGFR: 66 mL/min/{1.73_m2} (ref >59)

## 2021-02-21 LAB — TSH: TSH: 1.7 u[IU]/mL (ref 0.450–4.500)

## 2021-02-21 LAB — MAGNESIUM: Magnesium: 2.4 mg/dL — ABNORMAL HIGH (ref 1.6–2.3)

## 2021-02-21 LAB — T4, FREE: Free T4: 1.26 ng/dL (ref 0.82–1.77)

## 2021-02-22 ENCOUNTER — Other Ambulatory Visit: Payer: Self-pay

## 2021-02-22 ENCOUNTER — Encounter: Payer: Self-pay | Admitting: Cardiology

## 2021-02-22 ENCOUNTER — Ambulatory Visit (INDEPENDENT_AMBULATORY_CARE_PROVIDER_SITE_OTHER): Payer: Medicare Other | Admitting: Cardiology

## 2021-02-22 VITALS — BP 136/58 | HR 49 | Ht 69.5 in | Wt 219.0 lb

## 2021-02-22 DIAGNOSIS — I4892 Unspecified atrial flutter: Secondary | ICD-10-CM

## 2021-02-22 DIAGNOSIS — I10 Essential (primary) hypertension: Secondary | ICD-10-CM | POA: Diagnosis not present

## 2021-02-22 MED ORDER — ELIQUIS 5 MG PO TABS: 5.0000 mg | ORAL_TABLET | Freq: Two times a day (BID) | ORAL | 11 refills | Status: DC

## 2021-02-22 NOTE — Progress Notes (Signed)
Cardiology Office Note:    Date:  02/22/2021   ID:  Ryan Bentley, DOB June 28, 1932, MRN [id number]  PCP:  Ryan Sons, MD   [facility] Providers Cardiologist:  None     Referring MD: Ryan Sons, MD   Chief Complaint  Patient presents with  . New Patient (Initial Visit)    Referre by PCP for Afib. Meds reviewed verbally with patient.     History of Present Illness:    Ryan Bentley is a 87 y.o. male with a hx of hypertension who presents due to atrial fibrillation.  Patient saw primary care provider on 02/20/2021 for scheduled visit.  EKG obtained showed atrial fibrillation, slow ventricular response, heart rate 44.  Patient was started on Eliquis.  He denies chest pain, shortness of breath, palpitations, history of heart disease.  Feels well, denies edema.  Has no concerns at this time.  Previous echocardiogram 03/2015 showed normal systolic function, EF 55 to 60%  Past Medical History:  Diagnosis Date  . Arthritis   . Asthma   . C. difficile colitis 08/23/2016  . History of colon polyps    All benign per patient report  . History of echocardiogram    a. 03/2015: a. EF 55-60%, no WMA, nl diastolic fxn, trivial TR, nl PASP  . Hypertension   . Macular degeneration   . Migraine headache     Past Surgical History:  Procedure Laterality Date  . APPENDECTOMY    . CATARACT EXTRACTION     x2  . DENTAL SURGERY  10/2010  . PHOTODYNAMIC THERAPY    . TONSILLECTOMY AND ADENOIDECTOMY  1940's    Current Medications: Current Meds  Medication Sig  . albuterol (VENTOLIN HFA) 108 (90 Base) MCG/ACT inhaler Inhale 2 puffs into the lungs every 6 (six) hours as needed for wheezing or shortness of breath.  Marland Kitchen amLODipine (NORVASC) 5 MG tablet TAKE 1 TABLET BY MOUTH EVERY DAY  . hydrochlorothiazide (HYDRODIURIL) 25 MG tablet TAKE 1 TABLET BY MOUTH EVERY DAY  . Multiple Vitamins-Minerals (PRESERVISION AREDS 2) CAPS Take 1 capsule by mouth 2 (two) times a day.   .  Omega-3 Fatty Acids (FISH OIL PO) Take 1 capsule by mouth daily.  Marland Kitchen Spacer/Aero Chamber Mouthpiece MISC 1 Units by Does not apply route every 4 (four) hours as needed (wheezing).  . tamsulosin (FLOMAX) 0.4 MG CAPS capsule TAKE 1 CAPSULE (0.4 MG TOTAL) BY MOUTH AS NEEDED.  Marland Kitchen WIXELA INHUB 250-50 MCG/DOSE AEPB INHALE 1 PUFF BY MOUTH TWICE A DAY  . [DISCONTINUED] apixaban (ELIQUIS) 5 MG TABS tablet Take 1 tablet (5 mg total) by mouth 2 (two) times daily.     Allergies:   Iodine and Ivp dye [iodinated diagnostic agents]   Social History   Socioeconomic History  . Marital status: Widowed    Spouse name: Not on file  . Number of children: 2  . Years of education: Not on file  . Highest education level: Bachelor's degree (e.g., BA, AB, BS)  Occupational History  . Occupation: retired  Tobacco Use  . Smoking status: Former Smoker    Types: Cigarettes    Quit date: 09/30/1966    Years since quitting: 54.4  . Smokeless tobacco: Never Used  Vaping Use  . Vaping Use: Never used  Substance and Sexual Activity  . Alcohol use: Yes    Alcohol/week: 0.0 - 8.0 standard drinks    Comment: has a drink a night x 4 days  . Drug use:  No  . Sexual activity: Never  Other Topics Concern  . Not on file  Social History Narrative  . Not on file   Social Determinants of Health   Financial Resource Strain: Low Risk   . Difficulty of Paying Living Expenses: Not hard at all  Food Insecurity: No Food Insecurity  . Worried About Charity fundraiser in the Last Year: Never true  . Ran Out of Food in the Last Year: Never true  Transportation Needs: No Transportation Needs  . Lack of Transportation (Medical): No  . Lack of Transportation (Non-Medical): No  Physical Activity: Insufficiently Active  . Days of Exercise per Week: 6 days  . Minutes of Exercise per Session: 20 min  Stress: No Stress Concern Present  . Feeling of Stress : Not at all  Social Connections: Moderately Integrated  . Frequency of  Communication with Friends and Family: More than three times a week  . Frequency of Social Gatherings with Friends and Family: Twice a week  . Attends Religious Services: More than 4 times per year  . Active Member of Clubs or Organizations: Yes  . Attends Archivist Meetings: More than 4 times per year  . Marital Status: Widowed     Family History: The patient's family history includes Heart disease in his mother.  ROS:   Please see the history of present illness.     All other systems reviewed and are negative.  EKGs/Labs/Other Studies Reviewed:    The following studies were reviewed today:   EKG:  EKG is  ordered today.  The ekg ordered today demonstrates atrial flutter, heart rate 49  Recent Labs: 11/21/2020: Hemoglobin 14.1; Platelets 235 02/20/2021: ALT 10; BUN 14; Creatinine, Ser 1.09; Magnesium 2.4; Potassium 4.1; Sodium 140; TSH 1.700  Recent Lipid Panel No results found for: CHOL, TRIG, HDL, CHOLHDL, VLDL, LDLCALC, LDLDIRECT   Risk Assessment/Calculations:      Physical Exam:    VS:  BP (!) 136/58 (BP Location: Left Arm, Patient Position: Sitting, Cuff Size: Normal)   Pulse (!) 49   Ht 5' 9.5" (1.765 m)   Wt 219 lb (99.3 kg)   SpO2 98%   BMI 31.88 kg/m     Wt Readings from Last 3 Encounters:  02/22/21 219 lb (99.3 kg)  02/20/21 221 lb (100.2 kg)  11/21/20 226 lb (102.5 kg)     GEN:  Well nourished, well developed in no acute distress HEENT: Normal NECK: No JVD; No carotid bruits LYMPHATICS: No lymphadenopathy CARDIAC: Irregular irregular, bradycardic, no murmurs RESPIRATORY:  Clear to auscultation without rales, wheezing or rhonchi  ABDOMEN: Soft, non-tender, non-distended MUSCULOSKELETAL:  No edema; No deformity  SKIN: Warm and dry NEUROLOGIC:  Alert and oriented x 3 PSYCHIATRIC:  Normal affect   ASSESSMENT:    1. Primary hypertension   2. Atrial flutter, unspecified type ([place])    PLAN:    In order of problems listed  above:  1. Atrial flutter, EKG is from 12/2018, 01/2021 reviewed showing atrial flutter.  EKG today showing flutter, heart rate 49.  CHA2DS2-VASc score 3.  Continue Eliquis, patient is asymptomatic, bradycardic.  No indication for AV nodal blockers at this time.  History of Mobitz 1 AV block. 2. Hypertension, BP controlled.  Continue Norvasc, HCTZ.  Follow-up after echocardiogram   Medication Adjustments/Labs and Tests Ordered: Current medicines are reviewed at length with the patient today.  Concerns regarding medicines are outlined above.  Orders Placed This Encounter  Procedures  . EKG  12-Lead  . ECHOCARDIOGRAM COMPLETE   Meds ordered this encounter  Medications  . apixaban (ELIQUIS) 5 MG TABS tablet    Sig: Take 1 tablet (5 mg total) by mouth 2 (two) times daily.    Dispense:  60 tablet    Refill:  11    Patient Instructions  Medication Instructions:  Eliquis  5 mg Twice a day  Try and register the 30-day free sample card  Try and register the $10 co-pay card  If both does not work, can print out patient application on line if medication costly   *If you need a refill on your cardiac medications before your next appointment, please call your pharmacy*   Lab Work: None  Testing/Procedures: Your physician has requested that you have an echocardiogram. Echocardiography is a painless test that uses sound waves to create images of your heart. It provides your doctor with information about the size and shape of your heart and how well your heart's chambers and valves are working. This procedure takes approximately one hour. There are no restrictions for this procedure.  There is a possibility that an IV may need to be started during your test to inject an image enhancing agent. This is done to obtain more optimal pictures of your heart. Therefore we ask that you do at least drink some water prior to coming in to hydrate your veins.     Follow-Up:  Your next appointment:    1 month(s) (after your echo)  The format for your next appointment:   In Person  Provider:   You may see Agbor-Etang or one of the following Advanced Practice Providers on your designated Care Team:    Murray Hodgkins, NP  Christell Faith, PA-C  Marrianne Mood, PA-C  Cadence [place], [place]  Laurann Montana, NP      Signed, Kate Sable, MD  02/22/2021 1:02 PM    [place]

## 2021-02-22 NOTE — Patient Instructions (Addendum)
Medication Instructions:  Eliquis  5 mg Twice a day  Try and register the 30-day free sample card  Try and register the $10 co-pay card  If both does not work, can print out patient application on line if medication costly   *If you need a refill on your cardiac medications before your next appointment, please call your pharmacy*   Lab Work: None  Testing/Procedures: Your physician has requested that you have an echocardiogram. Echocardiography is a painless test that uses sound waves to create images of your heart. It provides your doctor with information about the size and shape of your heart and how well your heart's chambers and valves are working. This procedure takes approximately one hour. There are no restrictions for this procedure.  There is a possibility that an IV may need to be started during your test to inject an image enhancing agent. This is done to obtain more optimal pictures of your heart. Therefore we ask that you do at least drink some water prior to coming in to hydrate your veins.     Follow-Up:  Your next appointment:   1 month(s) (after your echo)  The format for your next appointment:   In Person  Provider:   You may see Agbor-Etang or one of the following Advanced Practice Providers on your designated Care Team:    Murray Hodgkins, NP  Christell Faith, PA-C  Marrianne Mood, PA-C  Cadence [place], [place]  Laurann Montana, NP

## 2021-02-28 ENCOUNTER — Other Ambulatory Visit: Payer: Self-pay | Admitting: Family Medicine

## 2021-02-28 DIAGNOSIS — J449 Chronic obstructive pulmonary disease, unspecified: Secondary | ICD-10-CM

## 2021-02-28 NOTE — Telephone Encounter (Signed)
Requested Prescriptions  Pending Prescriptions Disp Refills  . WIXELA INHUB 250-50 MCG/ACT AEPB [Pharmacy Med Name: [facility] 250-50 INHUB] 180 each 1    Sig: INHALE 1 PUFF BY MOUTH TWICE A DAY     Pulmonology:  Combination Products Passed - 02/28/2021  1:39 AM      Passed - Valid encounter within last 12 months    Recent Outpatient Visits          1 week ago Essential hypertension   [facility] Birdie Sons, MD   3 months ago Essential hypertension   [facility] Birdie Sons, MD   6 months ago Chronic obstructive pulmonary disease, unspecified COPD type [facility])   [facility] Birdie Sons, MD   9 months ago Onychomycosis   [facility] Birdie Sons, MD   1 year ago Essential hypertension   [facility] Birdie Sons, MD      Future Appointments            In 1 month Agbor-Etang, Aaron Edelman, MD [facility]   In 6 months Fisher, Kirstie Peri, MD [facility], [place]

## 2021-03-07 DIAGNOSIS — H40003 Preglaucoma, unspecified, bilateral: Secondary | ICD-10-CM | POA: Diagnosis not present

## 2021-03-08 DIAGNOSIS — H40003 Preglaucoma, unspecified, bilateral: Secondary | ICD-10-CM | POA: Diagnosis not present

## 2021-03-26 DIAGNOSIS — H353221 Exudative age-related macular degeneration, left eye, with active choroidal neovascularization: Secondary | ICD-10-CM | POA: Diagnosis not present

## 2021-04-01 IMAGING — CT CT OF THE LEFT KNEE WITHOUT CONTRAST
3 series · 12 of 33 positions shown, 14 images · non-contrast
Comparison: Radiograph earlier this day.

CLINICAL DATA: Fall today with left knee pain, fracture on
radiograph.

EXAM:
CT OF THE LEFT KNEE WITHOUT CONTRAST
TECHNIQUE: Multidetector CT imaging of the LEFT knee was performed according to
the standard protocol. Multiplanar CT image reconstructions were
also generated.

[Series 6: axial st · axial · 0.34mm/px · z∈[+64,+240]mm · 4 of 169 slices shown, 5 images]
[im 26/169  soft-tissue]
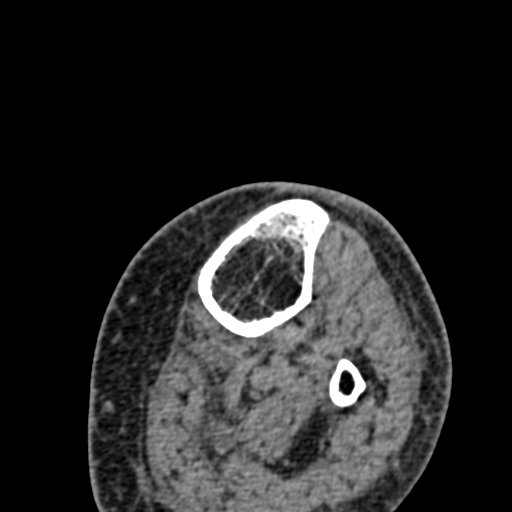
[im 26/169  bone]
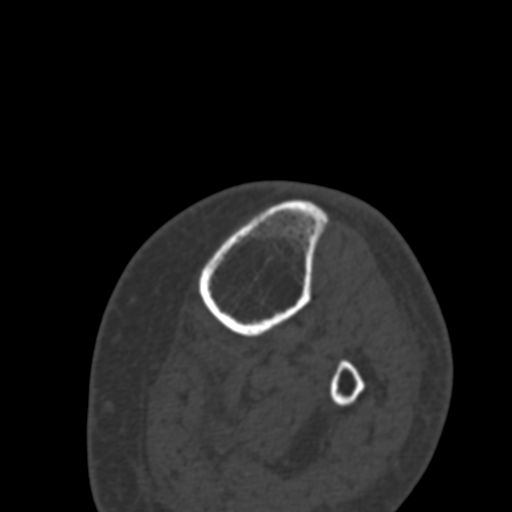
[im 65/169  bone]
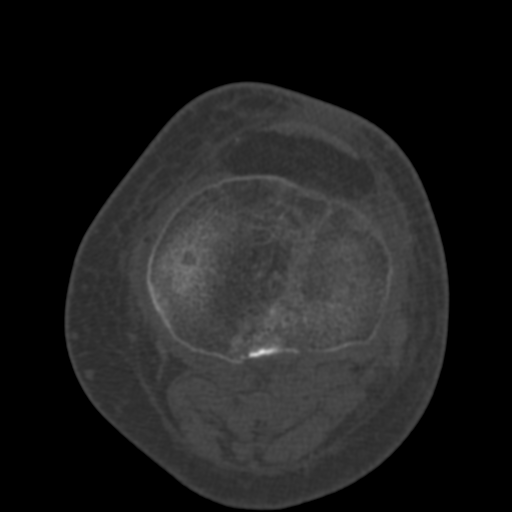
[im 104/169  bone]
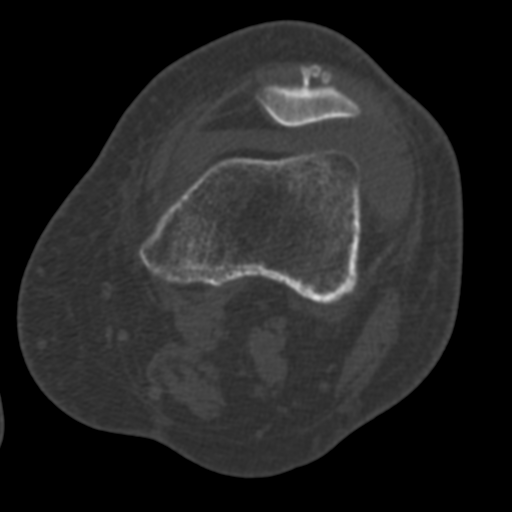
[im 143/169  bone]
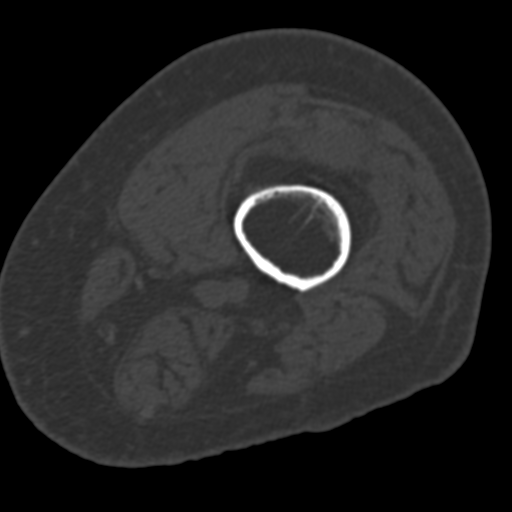

[Series 9: coronal st · coronal · 0.34mm/px · 3 of 117 slices shown]
[im 24/117  bone]
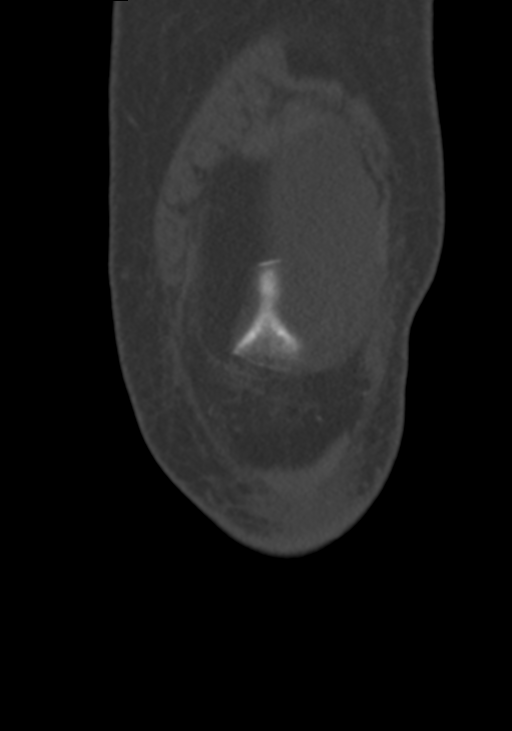
[im 47/117  bone]
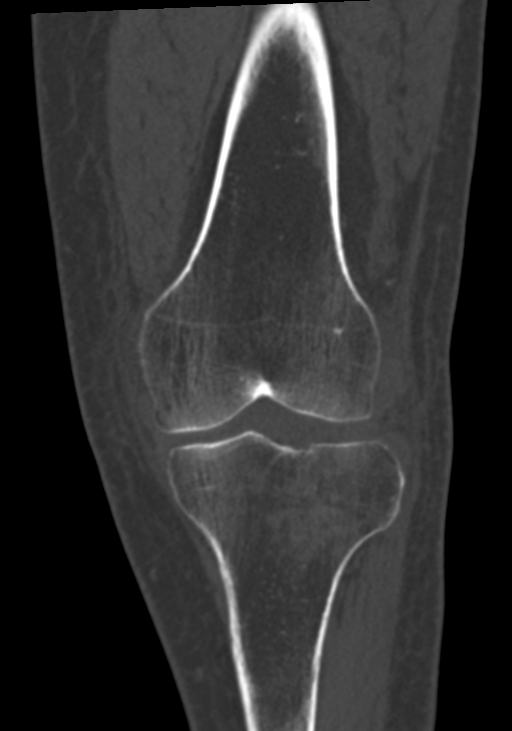
[im 70/117  bone]
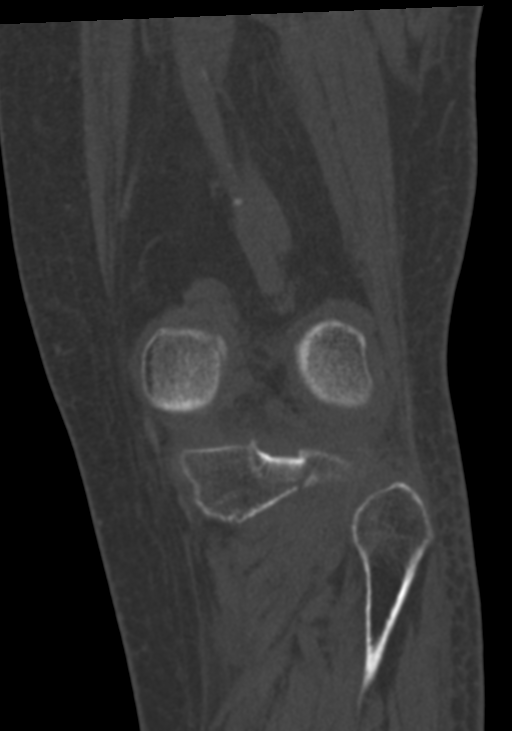

[Series 10: sagittal st · sagittal · 0.34mm/px · 5 of 117 slices shown, 6 images]
[im 39/117  bone]
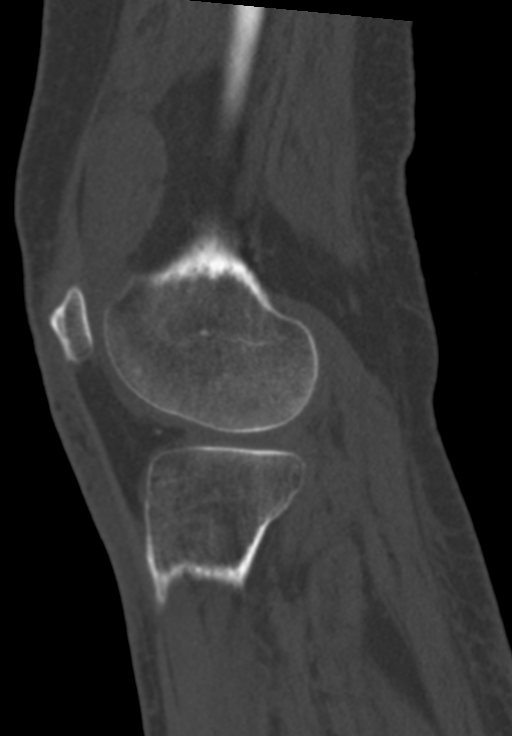
[im 49/117  bone]
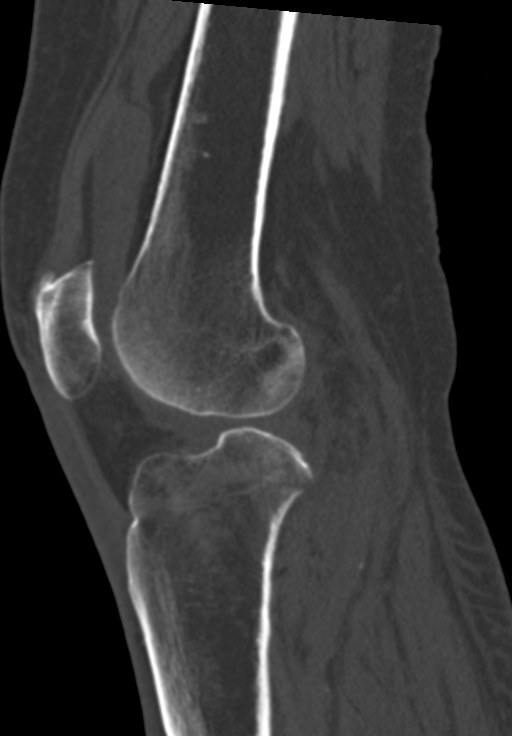
[im 59/117  soft-tissue]
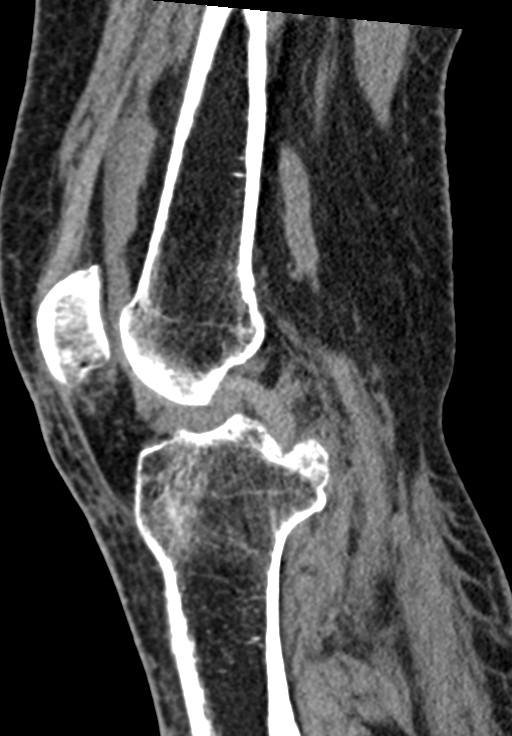
[im 59/117  bone]
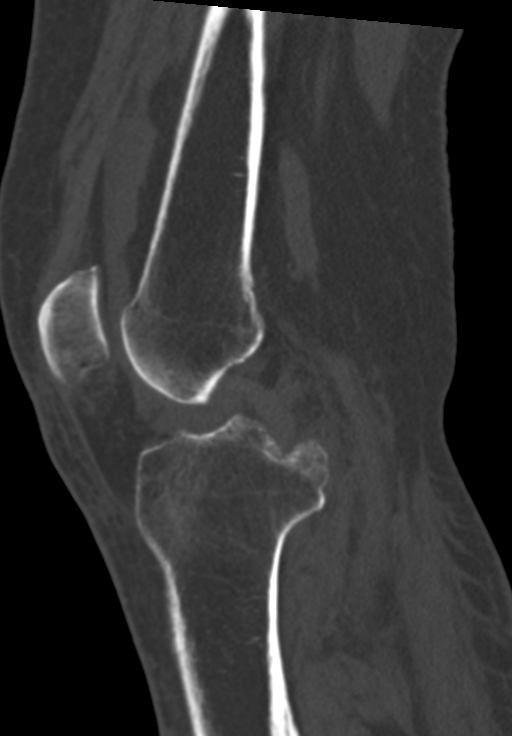
[im 68/117  bone]
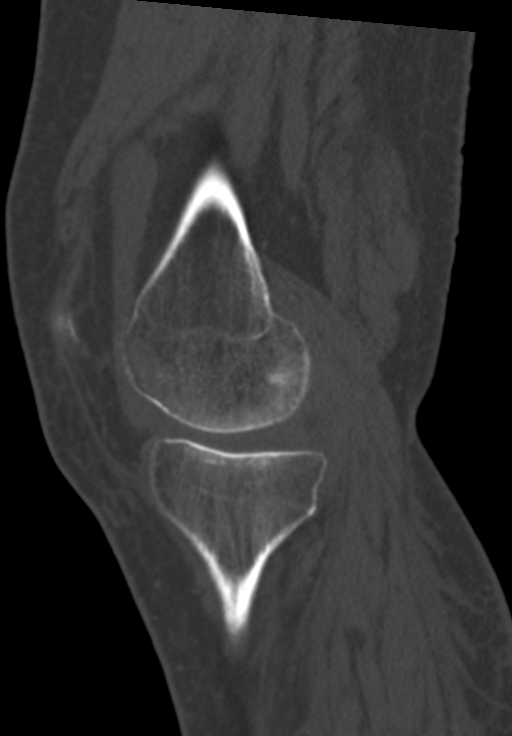
[im 78/117  bone]
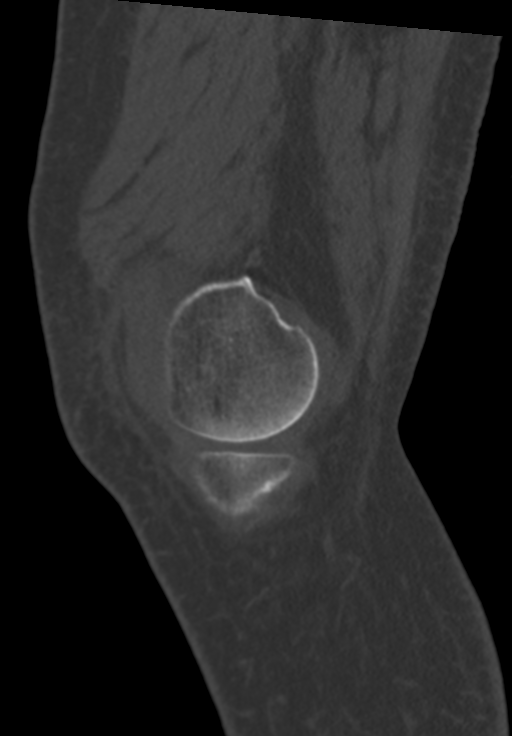

[12 of 33 positions shown; findings below may reference images not displayed]

FINDINGS: Bones/Joint/Cartilage

Lateral tibial plateau fracture primarily involving the posterior
cortex 3 mm articular distraction. Nondisplaced component extends to
the tibial spine and posteriorly. Probable impaction component
involving the metaphysis with altered marrow density. No involvement
medial to the tibial spines. No evidence of distal femur fracture,
appearance on radiograph was likely condylar overlap. Mild
tricompartmental spurring for age. There is moderate
lipohemarthrosis.

Ligaments

Not well assessed on CT. ACL, PCL, and MCL fibers are visualized.

Muscles and Tendons

No intramuscular hematoma. Quadriceps and patellar tendons are
intact.

Soft tissues

Generalized soft tissue edema most prominent anterior and laterally.
IMPRESSION: Lateral tibial plateau fracture with 3 mm articular disruption
(Schatzker type 1 fracture). Moderate lipohemarthrosis.

Cortical irregularity of the femoral condyles on radiograph was
likely osseous overlap, no evidence of femoral condylar fracture.

## 2021-04-06 ENCOUNTER — Ambulatory Visit (INDEPENDENT_AMBULATORY_CARE_PROVIDER_SITE_OTHER): Payer: Medicare Other

## 2021-04-06 ENCOUNTER — Other Ambulatory Visit: Payer: Self-pay

## 2021-04-06 DIAGNOSIS — I4892 Unspecified atrial flutter: Secondary | ICD-10-CM

## 2021-04-09 LAB — ECHOCARDIOGRAM COMPLETE
AR max vel: 3.72 cm2
AV Area VTI: 4.33 cm2
AV Area mean vel: 3.93 cm2
AV Mean grad: 6 mmHg
AV Peak grad: 12.5 mmHg
Ao pk vel: 1.77 m/s
Calc EF: 54.5 %
S' Lateral: 3.5 cm
Single Plane A2C EF: 54.7 %
Single Plane A4C EF: 56.8 %

## 2021-04-10 ENCOUNTER — Telehealth: Payer: Self-pay

## 2021-04-10 NOTE — Telephone Encounter (Signed)
Called patient and gave spoke with his daughter per DPR on file and gave the following echo result note:  Echocardiogram shows normal systolic function, moderately dilated left atrium.  Moderate MR.  Overall okay echocardiogram, no gross structural abnormalities. Continue medications as prescribed.  Keep follow-up appointment.  Also spoke with daughter in response to the following incoming MyChart message:  Koegel, Wylie Hail, MD 3 hours ago (9:10 AM)   Good morning, I have received the results from my echocardiogram (04/06/2021).  My blood thinner meds have run out.  Should I refill and continue them? Thank you for your kind care of me.  I will be moving to [place], [place] to be near a daughter.  Will I need a cardiologist in the [place] area (zip code [phone])?  Do you have a recommendation?   Ryan Bentley  Patient has a follow up scheduled for this Friday 04/13/21, however he is moving to [place] that day. Patients daughter would like to keep it scheduled in case he can make it in prior to their departure, but would love to get in Thursday if we have a cancellation. I informed her that I would keep this in my inbasket in case we have a cancellation on Thursday.  I informed her that we do not have any recommendations for a cardiologist in [place] but as soon as they find one to have them request records from our office. I also informed her that it is extremely important for patient to not miss any doses of his Eliquis and explained the reasons why to her. She was very grateful for the information on this and stated that they will get it refilled ASAP today. She said that he will be moving closer to his other daughter and that she will also inform her of the importance to not miss any doses in the future.  Patients daughter was very grateful for the call, and stated she will let us know if they need to cancel the appointment on 04/13/21 if she has not heard anything from [place]  first.

## 2021-04-13 ENCOUNTER — Ambulatory Visit (INDEPENDENT_AMBULATORY_CARE_PROVIDER_SITE_OTHER): Payer: Medicare Other | Admitting: Cardiology

## 2021-04-13 ENCOUNTER — Encounter: Payer: Self-pay | Admitting: Cardiology

## 2021-04-13 ENCOUNTER — Other Ambulatory Visit: Payer: Self-pay

## 2021-04-13 VITALS — BP 136/52 | HR 56 | Ht 69.5 in | Wt 212.0 lb

## 2021-04-13 DIAGNOSIS — I10 Essential (primary) hypertension: Secondary | ICD-10-CM

## 2021-04-13 DIAGNOSIS — I4892 Unspecified atrial flutter: Secondary | ICD-10-CM | POA: Diagnosis not present

## 2021-04-13 NOTE — Patient Instructions (Signed)
Medication Instructions:   Your physician recommends that you continue on your current medications as directed. Please refer to the Current Medication list given to you today.  *If you need a refill on your cardiac medications before your next appointment, please call your pharmacy*   Lab Work:  None ordered  If you have labs (blood work) drawn today and your tests are completely normal, you will receive your results only by: [place] (if you have MyChart) OR A paper copy in the mail If you have any lab test that is abnormal or we need to change your treatment, we will call you to review the results.   Testing/Procedures:  None Ordered   Follow-Up: At [facility], you and your health needs are our priority.  As part of our continuing mission to provide you with exceptional heart care, we have created designated Provider Care Teams.  These Care Teams include your primary Cardiologist (physician) and Advanced Practice Providers (APPs -  Physician Assistants and Nurse Practitioners) who all work together to provide you with the care you need, when you need it.  We recommend signing up for the patient portal called "MyChart".  Sign up information is provided on this After Visit Summary.  MyChart is used to connect with patients for Virtual Visits (Telemedicine).  Patients are able to view lab/test results, encounter notes, upcoming appointments, etc.  Non-urgent messages can be sent to your provider as well.   To learn more about what you can do with MyChart, go to [email or web address].    Your next appointment:      The format for your next appointment:     Provider:   Patient is moving to [place]. Will establish care there.

## 2021-04-13 NOTE — Progress Notes (Signed)
Cardiology Office Note:    Date:  04/13/2021   ID:  Ryan Bentley, DOB 10-20-1931, MRN [id number]  PCP:  Birdie Sons, MD   [facility] Providers Cardiologist:  None     Referring MD: Birdie Sons, MD   Chief Complaint  Patient presents with   Other    Follow up post ECHO. Med reviewed verbally with patient.     History of Present Illness:    Ryan Bentley is a 87 y.o. male with a hx of hypertension, atrial flutter on Eliquis who presents for follow-up.  Patient being seen due to atrial flutter.  Denies any symptoms of palpitations.  Had an echocardiogram performed to evaluate any structural abnormalities.  Patient is moving to [place] today and plans to establish with new cardiologist over in [place].  He feels well, has no concerns at this time.  Taking all medications as prescribed.  Prior notes echocardiogram 03/2015 showed normal systolic function, EF 55 to 60%  Past Medical History:  Diagnosis Date   Arthritis    Asthma    C. difficile colitis 08/23/2016   History of colon polyps    All benign per patient report   History of echocardiogram    a. 03/2015: a. EF 55-60%, no WMA, nl diastolic fxn, trivial TR, nl PASP   Hypertension    Macular degeneration    Migraine headache     Past Surgical History:  Procedure Laterality Date   APPENDECTOMY     CATARACT EXTRACTION     x2   DENTAL SURGERY  10/2010   PHOTODYNAMIC THERAPY     TONSILLECTOMY AND ADENOIDECTOMY  1940's    Current Medications: Current Meds  Medication Sig   albuterol (VENTOLIN HFA) 108 (90 Base) MCG/ACT inhaler Inhale 2 puffs into the lungs every 6 (six) hours as needed for wheezing or shortness of breath.   amLODipine (NORVASC) 5 MG tablet TAKE 1 TABLET BY MOUTH EVERY DAY   apixaban (ELIQUIS) 5 MG TABS tablet Take 1 tablet (5 mg total) by mouth 2 (two) times daily.   hydrochlorothiazide (HYDRODIURIL) 25 MG tablet TAKE 1 TABLET BY MOUTH EVERY DAY   Multiple  Vitamins-Minerals (PRESERVISION AREDS 2) CAPS Take 1 capsule by mouth 2 (two) times a day.    Omega-3 Fatty Acids (FISH OIL PO) Take 1 capsule by mouth daily.   Spacer/Aero Chamber Mouthpiece MISC 1 Units by Does not apply route every 4 (four) hours as needed (wheezing).   tamsulosin (FLOMAX) 0.4 MG CAPS capsule TAKE 1 CAPSULE (0.4 MG TOTAL) BY MOUTH AS NEEDED.   WIXELA INHUB 250-50 MCG/ACT AEPB INHALE 1 PUFF BY MOUTH TWICE A DAY     Allergies:   Iodine and Ivp dye [iodinated diagnostic agents]   Social History   Socioeconomic History   Marital status: Widowed    Spouse name: Not on file   Number of children: 2   Years of education: Not on file   Highest education level: Bachelor's degree (e.g., BA, AB, BS)  Occupational History   Occupation: retired  Tobacco Use   Smoking status: Former    Types: Cigarettes    Quit date: 09/30/1966    Years since quitting: 54.5   Smokeless tobacco: Never  Vaping Use   Vaping Use: Never used  Substance and Sexual Activity   Alcohol use: Yes    Alcohol/week: 0.0 - 8.0 standard drinks    Comment: has a drink a night x 4 days   Drug use:  No   Sexual activity: Never  Other Topics Concern   Not on file  Social History Narrative   Not on file   Social Determinants of Health   Financial Resource Strain: Low Risk    Difficulty of Paying Living Expenses: Not hard at all  Food Insecurity: No Food Insecurity   Worried About Charity fundraiser in the Last Year: Never true   [place] in the Last Year: Never true  Transportation Needs: No Transportation Needs   Lack of Transportation (Medical): No   Lack of Transportation (Non-Medical): No  Physical Activity: Insufficiently Active   Days of Exercise per Week: 6 days   Minutes of Exercise per Session: 20 min  Stress: No Stress Concern Present   Feeling of Stress : Not at all  Social Connections: Moderately Integrated   Frequency of Communication with Friends and Family: More than three  times a week   Frequency of Social Gatherings with Friends and Family: Twice a week   Attends Religious Services: More than 4 times per year   Active Member of [employer] or Organizations: Yes   Attends Archivist Meetings: More than 4 times per year   Marital Status: Widowed     Family History: The patient's family history includes Heart disease in his mother.  ROS:   Please see the history of present illness.     All other systems reviewed and are negative.  EKGs/Labs/Other Studies Reviewed:    The following studies were reviewed today:   EKG:  EKG not ordered today.    Recent Labs: 11/21/2020: Hemoglobin 14.1; Platelets 235 02/20/2021: ALT 10; BUN 14; Creatinine, Ser 1.09; Magnesium 2.4; Potassium 4.1; Sodium 140; TSH 1.700  Recent Lipid Panel No results found for: CHOL, TRIG, HDL, CHOLHDL, VLDL, LDLCALC, LDLDIRECT   Risk Assessment/Calculations:      Physical Exam:    VS:  BP (!) 136/52 (BP Location: Left Arm, Patient Position: Sitting, Cuff Size: Normal)   Pulse (!) 56   Ht 5' 9.5" (1.765 m)   Wt 212 lb (96.2 kg)   SpO2 99%   BMI 30.86 kg/m     Wt Readings from Last 3 Encounters:  04/13/21 212 lb (96.2 kg)  02/22/21 219 lb (99.3 kg)  02/20/21 221 lb (100.2 kg)     GEN:  Well nourished, well developed in no acute distress HEENT: Normal NECK: No JVD; No carotid bruits LYMPHATICS: No lymphadenopathy CARDIAC: Irregular irregular, bradycardic, no murmurs RESPIRATORY:  Clear to auscultation without rales, wheezing or rhonchi  ABDOMEN: Soft, non-tender, non-distended MUSCULOSKELETAL:  No edema; No deformity  SKIN: Warm and dry NEUROLOGIC:  Alert and oriented x 3 PSYCHIATRIC:  Normal affect   ASSESSMENT:    1. Atrial flutter, unspecified type ([place])   2. Primary hypertension     PLAN:    In order of problems listed above:  Atrial flutter, heart rate 56 today, EKG not obtained today.  CHA2DS2-VASc score 3.  Continue Eliquis, patient is  asymptomatic, echo 03/2021 shows normal systolic function, EF 55 to 60%.  Bradycardic.  No indication for AV nodal blockers at this time. Hypertension, BP controlled.  Continue Norvasc, HCTZ.  Follow-up as needed.  Planning on moving to [facility] later today.     Medication Adjustments/Labs and Tests Ordered: Current medicines are reviewed at length with the patient today.  Concerns regarding medicines are outlined above.  No orders of the defined types were placed in this encounter.  No orders  of the defined types were placed in this encounter.   Patient Instructions  Medication Instructions:   Your physician recommends that you continue on your current medications as directed. Please refer to the Current Medication list given to you today.  *If you need a refill on your cardiac medications before your next appointment, please call your pharmacy*   Lab Work:  None ordered  If you have labs (blood work) drawn today and your tests are completely normal, you will receive your results only by: [place] (if you have MyChart) OR A paper copy in the mail If you have any lab test that is abnormal or we need to change your treatment, we will call you to review the results.   Testing/Procedures:  None Ordered   Follow-Up: At [facility], you and your health needs are our priority.  As part of our continuing mission to provide you with exceptional heart care, we have created designated Provider Care Teams.  These Care Teams include your primary Cardiologist (physician) and Advanced Practice Providers (APPs -  Physician Assistants and Nurse Practitioners) who all work together to provide you with the care you need, when you need it.  We recommend signing up for the patient portal called "MyChart".  Sign up information is provided on this After Visit Summary.  MyChart is used to connect with patients for Virtual Visits (Telemedicine).  Patients are able to view lab/test  results, encounter notes, upcoming appointments, etc.  Non-urgent messages can be sent to your provider as well.   To learn more about what you can do with MyChart, go to [email or web address].    Your next appointment:      The format for your next appointment:     Provider:   Patient is moving to [place]. Will establish care there.      Signed, Kate Sable, MD  04/13/2021 12:23 PM    [place]

## 2021-05-07 ENCOUNTER — Telehealth: Payer: Self-pay

## 2021-05-07 MED ORDER — APIXABAN 5 MG PO TABS: 5.0000 mg | ORAL_TABLET | Freq: Two times a day (BID) | ORAL | 5 refills | Status: AC

## 2021-05-07 NOTE — Telephone Encounter (Signed)
Prescription refill request for Eliquis received. Indication: Atrial Flutter Last office visit: 04/13/21  Agbor-Etang MD Scr: 1.09 on 02/20/21 Age: 87 Weight: 96.2kg  Based on above findings Eliquis '5mg'$  twice daily is the appropriate dose.  Refill approved.

## 2021-05-07 NOTE — Telephone Encounter (Signed)
*  STAT* If patient is at the pharmacy, call can be transferred to refill team.   1. Which medications need to be refilled? (please list name of each medication and dose if known) Eliquis  2. Which pharmacy/location (including street and city if local pharmacy) is medication to be sent to? [facility]     3. Do they need a 30 day or 90 day supply? [place]

## 2021-05-10 ENCOUNTER — Other Ambulatory Visit: Payer: Self-pay | Admitting: Family Medicine

## 2021-05-10 DIAGNOSIS — I10 Essential (primary) hypertension: Secondary | ICD-10-CM

## 2021-05-10 NOTE — Telephone Encounter (Signed)
  Notes to clinic:  DX Code Needed     Requested Prescriptions  Pending Prescriptions Disp Refills   amLODipine (NORVASC) 5 MG tablet [Pharmacy Med Name: AMLODIPINE BESYLATE 5 MG TAB] 90 tablet 1    Sig: TAKE 1 TABLET BY MOUTH EVERY DAY     Cardiovascular:  Calcium Channel Blockers Passed - 05/10/2021  9:14 AM      Passed - Last BP in normal range    BP Readings from Last 1 Encounters:  04/13/21 (!) 136/52          Passed - Valid encounter within last 6 months    Recent Outpatient Visits           2 months ago Essential hypertension   [place], [place]   5 months ago Essential hypertension   [facility] Birdie Sons, MD   9 months ago Chronic obstructive pulmonary disease, unspecified COPD type [facility])   [facility] Birdie Sons, MD   1 year ago Onychomycosis   [facility] Birdie Sons, MD   1 year ago Essential hypertension   [facility] Birdie Sons, MD       Future Appointments             In 3 months Fisher, Kirstie Peri, MD [facility], [place]

## 2021-05-17 ENCOUNTER — Other Ambulatory Visit: Payer: Self-pay | Admitting: Family Medicine

## 2021-05-17 DIAGNOSIS — I10 Essential (primary) hypertension: Secondary | ICD-10-CM

## 2021-05-21 ENCOUNTER — Other Ambulatory Visit: Payer: Self-pay | Admitting: Family Medicine

## 2021-05-21 DIAGNOSIS — N400 Enlarged prostate without lower urinary tract symptoms: Secondary | ICD-10-CM

## 2021-08-02 ENCOUNTER — Other Ambulatory Visit: Payer: Self-pay | Admitting: Family Medicine

## 2021-08-02 DIAGNOSIS — J449 Chronic obstructive pulmonary disease, unspecified: Secondary | ICD-10-CM

## 2021-08-02 NOTE — Telephone Encounter (Signed)
Requested Prescriptions  Pending Prescriptions Disp Refills  . WIXELA INHUB 250-50 MCG/ACT AEPB [Pharmacy Med Name: [place] 250-50 INHUB] 180 each 0    Sig: TAKE 1 PUFF BY MOUTH TWICE A DAY     Pulmonology:  Combination Products Passed - 08/02/2021 12:22 AM      Passed - Valid encounter within last 12 months    Recent Outpatient Visits          5 months ago Essential hypertension   [facility] Birdie Sons, MD   8 months ago Essential hypertension   [facility] Birdie Sons, MD   12 months ago Chronic obstructive pulmonary disease, unspecified COPD type [facility])   [facility] Birdie Sons, MD   1 year ago Onychomycosis   [facility] Birdie Sons, MD   1 year ago Essential hypertension   [facility] Birdie Sons, MD      Future Appointments            In 4 weeks Fisher, Kirstie Peri, MD [facility], [place]

## 2021-08-02 NOTE — Telephone Encounter (Signed)
[facility] [place]

## 2021-08-15 ENCOUNTER — Other Ambulatory Visit: Payer: Self-pay | Admitting: Family Medicine

## 2021-08-15 DIAGNOSIS — I10 Essential (primary) hypertension: Secondary | ICD-10-CM

## 2021-08-15 NOTE — Telephone Encounter (Signed)
Requested Prescriptions  Pending Prescriptions Disp Refills  . hydrochlorothiazide (HYDRODIURIL) 25 MG tablet [Pharmacy Med Name: HYDROCHLOROTHIAZIDE 25 MG TAB] 90 tablet 0    Sig: TAKE 1 TABLET BY MOUTH EVERY DAY     Cardiovascular: Diuretics - Thiazide Passed - 08/15/2021 12:08 AM      Passed - Ca in normal range and within 360 days    Calcium  Date Value Ref Range Status  02/20/2021 9.6 8.6 - 10.2 mg/dL Final         Passed - Cr in normal range and within 360 days    Creatinine, Ser  Date Value Ref Range Status  02/20/2021 1.09 0.76 - 1.27 mg/dL Final         Passed - K in normal range and within 360 days    Potassium  Date Value Ref Range Status  02/20/2021 4.1 3.5 - 5.2 mmol/L Final         Passed - Na in normal range and within 360 days    Sodium  Date Value Ref Range Status  02/20/2021 140 134 - 144 mmol/L Final         Passed - Last BP in normal range    BP Readings from Last 1 Encounters:  04/13/21 (!) 136/52         Passed - Valid encounter within last 6 months    Recent Outpatient Visits          5 months ago Essential hypertension   [facility] Birdie Sons, MD   8 months ago Essential hypertension   [facility] Birdie Sons, MD   1 year ago Chronic obstructive pulmonary disease, unspecified COPD type [facility])   [facility] Birdie Sons, MD   1 year ago Onychomycosis   [facility] Birdie Sons, MD   1 year ago Essential hypertension   [place], Kirstie Peri, MD      Future Appointments            In 2 weeks Fisher, Kirstie Peri, MD [facility], [place]

## 2021-08-31 ENCOUNTER — Ambulatory Visit: Payer: Self-pay | Admitting: Family Medicine

## 2021-11-18 ENCOUNTER — Other Ambulatory Visit: Payer: Self-pay | Admitting: Family Medicine

## 2021-11-18 DIAGNOSIS — I10 Essential (primary) hypertension: Secondary | ICD-10-CM

## 2022-02-16 ENCOUNTER — Other Ambulatory Visit: Payer: Self-pay | Admitting: Family Medicine

## 2022-02-16 DIAGNOSIS — I10 Essential (primary) hypertension: Secondary | ICD-10-CM

## 2022-04-08 ENCOUNTER — Other Ambulatory Visit: Payer: Self-pay | Admitting: Family Medicine

## 2022-04-08 DIAGNOSIS — I10 Essential (primary) hypertension: Secondary | ICD-10-CM

## 2022-04-14 ENCOUNTER — Other Ambulatory Visit: Payer: Self-pay | Admitting: Family Medicine

## 2022-04-14 DIAGNOSIS — J449 Chronic obstructive pulmonary disease, unspecified: Secondary | ICD-10-CM

## 2022-07-16 ENCOUNTER — Other Ambulatory Visit: Payer: Self-pay | Admitting: Family Medicine

## 2022-07-16 DIAGNOSIS — N400 Enlarged prostate without lower urinary tract symptoms: Secondary | ICD-10-CM

## 2022-07-17 ENCOUNTER — Other Ambulatory Visit: Payer: Self-pay | Admitting: Family Medicine

## 2022-07-17 DIAGNOSIS — J449 Chronic obstructive pulmonary disease, unspecified: Secondary | ICD-10-CM

## 2022-09-02 ENCOUNTER — Telehealth: Payer: Self-pay

## 2022-09-02 NOTE — Telephone Encounter (Signed)
Transition Care Management Follow-up Telephone Call Date of discharge and from where: [facility] 08/30/2022. Patient Transferred- living in [place]. How have you been since you were released from the hospital? good Any questions or concerns? No  Items Reviewed: Did the pt receive and understand the discharge instructions provided? Yes  Medications obtained and verified? Yes  Other? No  Any new allergies since your discharge? No  Dietary orders reviewed? Yes Do you have support at home? Yes   Home Care and Equipment/Supplies: Were home health services ordered? no If so, what is the name of the agency? N/a  Has the agency set up a time to come to the patient's home? not applicable Were any new equipment or medical supplies ordered?  No What is the name of the medical supply agency? N/a Were you able to get the supplies/equipment? not applicable Do you have any questions related to the use of the equipment or supplies? No  Functional Questionnaire: (I = Independent and D = Dependent) ADLs: I  Bathing/Dressing- I  Meal Prep- I  Eating- I  Maintaining continence- I  Transferring/Ambulation- I  Managing Meds- I  Follow up appointments reviewed:  PCP Hospital f/u appt confirmed? No  Transferred [place] [facility] f/u appt confirmed? No   Are transportation arrangements needed? No  If their condition worsens, is the pt aware to call PCP or go to the Emergency Dept.? Yes Was the patient provided with contact information for the PCP's office or ED? Yes Was to pt encouraged to call back with questions or concerns? Yes .lh

## 2022-12-30 DEATH — deceased

## 2023-02-14 ENCOUNTER — Telehealth: Payer: Self-pay | Admitting: Family Medicine

## 2023-02-14 NOTE — Telephone Encounter (Signed)
According to cancellation reason for 08/31/2021 appointment, patient moved to [place].  Please remove PCP.
